# Patient Record
Sex: Male | Born: 1970 | Race: White | Hispanic: No | Marital: Married | Smoking: Never smoker
Health system: Southern US, Community
[De-identification: ages and names within clinical notes are randomized; demographics above are authoritative.]

## PROBLEM LIST (undated history)

## (undated) DIAGNOSIS — F32A Depression, unspecified: Secondary | ICD-10-CM

## (undated) DIAGNOSIS — M549 Dorsalgia, unspecified: Secondary | ICD-10-CM

## (undated) DIAGNOSIS — F4310 Post-traumatic stress disorder, unspecified: Secondary | ICD-10-CM

## (undated) DIAGNOSIS — F329 Major depressive disorder, single episode, unspecified: Secondary | ICD-10-CM

## (undated) DIAGNOSIS — G4730 Sleep apnea, unspecified: Secondary | ICD-10-CM

## (undated) DIAGNOSIS — K900 Celiac disease: Secondary | ICD-10-CM

## (undated) DIAGNOSIS — M25511 Pain in right shoulder: Secondary | ICD-10-CM

## (undated) DIAGNOSIS — M25569 Pain in unspecified knee: Secondary | ICD-10-CM

## (undated) DIAGNOSIS — S069X9A Unspecified intracranial injury with loss of consciousness of unspecified duration, initial encounter: Secondary | ICD-10-CM

## (undated) HISTORY — PX: CHOLECYSTECTOMY: SHX55

## (undated) HISTORY — PX: APPENDECTOMY: SHX54

---

## 2004-11-03 ENCOUNTER — Emergency Department: Payer: Self-pay | Admitting: Unknown Physician Specialty

## 2004-11-21 ENCOUNTER — Emergency Department: Payer: Self-pay | Admitting: Internal Medicine

## 2008-11-18 ENCOUNTER — Emergency Department: Payer: Self-pay | Admitting: Internal Medicine

## 2011-03-06 ENCOUNTER — Ambulatory Visit: Payer: Self-pay | Admitting: Gastroenterology

## 2012-02-14 ENCOUNTER — Ambulatory Visit: Payer: Self-pay | Admitting: Internal Medicine

## 2012-03-06 ENCOUNTER — Ambulatory Visit: Payer: Self-pay | Admitting: Internal Medicine

## 2012-08-05 ENCOUNTER — Emergency Department: Payer: Self-pay | Admitting: Emergency Medicine

## 2012-11-17 ENCOUNTER — Encounter (HOSPITAL_COMMUNITY): Payer: Self-pay | Admitting: Emergency Medicine

## 2012-11-17 ENCOUNTER — Emergency Department (HOSPITAL_COMMUNITY)
Admission: EM | Admit: 2012-11-17 | Discharge: 2012-11-17 | Disposition: A | Discharge status: Home / Self Care | Attending: Emergency Medicine | Admitting: Emergency Medicine

## 2012-11-17 ENCOUNTER — Emergency Department (HOSPITAL_COMMUNITY)

## 2012-11-17 DIAGNOSIS — Y9289 Other specified places as the place of occurrence of the external cause: Secondary | ICD-10-CM | POA: Insufficient documentation

## 2012-11-17 DIAGNOSIS — S43409A Unspecified sprain of unspecified shoulder joint, initial encounter: Secondary | ICD-10-CM

## 2012-11-17 DIAGNOSIS — X500XXA Overexertion from strenuous movement or load, initial encounter: Secondary | ICD-10-CM | POA: Insufficient documentation

## 2012-11-17 DIAGNOSIS — F3289 Other specified depressive episodes: Secondary | ICD-10-CM | POA: Insufficient documentation

## 2012-11-17 DIAGNOSIS — Z79899 Other long term (current) drug therapy: Secondary | ICD-10-CM | POA: Insufficient documentation

## 2012-11-17 DIAGNOSIS — Y9389 Activity, other specified: Secondary | ICD-10-CM | POA: Insufficient documentation

## 2012-11-17 DIAGNOSIS — Z8659 Personal history of other mental and behavioral disorders: Secondary | ICD-10-CM | POA: Insufficient documentation

## 2012-11-17 DIAGNOSIS — F329 Major depressive disorder, single episode, unspecified: Secondary | ICD-10-CM | POA: Insufficient documentation

## 2012-11-17 DIAGNOSIS — IMO0002 Reserved for concepts with insufficient information to code with codable children: Secondary | ICD-10-CM | POA: Insufficient documentation

## 2012-11-17 HISTORY — DX: Post-traumatic stress disorder, unspecified: F43.10

## 2012-11-17 HISTORY — DX: Depression, unspecified: F32.A

## 2012-11-17 HISTORY — DX: Major depressive disorder, single episode, unspecified: F32.9

## 2012-11-17 MED ORDER — IBUPROFEN 200 MG PO TABS
600.0000 mg | ORAL_TABLET | Freq: Once | ORAL | Status: AC
  Administered 2012-11-17: 600 mg via ORAL
  Filled 2012-11-17: qty 3

## 2012-11-17 MED ORDER — MELOXICAM 7.5 MG PO TABS: 15.0000 mg | ORAL_TABLET | Freq: Every day | ORAL | Status: DC

## 2012-11-17 MED ORDER — MELOXICAM 15 MG PO TABS: 15.0000 mg | ORAL_TABLET | Freq: Every day | ORAL | Status: DC

## 2012-11-17 NOTE — ED Provider Notes (Signed)
History     CSN: [id number]  Arrival date & time 11/17/12  1030   First MD Initiated Contact with Patient 11/17/12 1056      Chief Complaint  Patient presents with  . Shoulder Pain    (Consider location/radiation/quality/duration/timing/severity/associated sxs/prior treatment) HPI Comments: Patient with acute onset of right anterior shoulder pain after pulling on a heavy [place] door. He states that he felt a 'pop' in the shoulder. He can still move his arm but with significant tenderness. Patient states he has some tingling sensation on the ulnar aspect of his forearm and hand into his fourth and fifth digits. No treatments prior to arrival. No neck pain. No trouble breathing or shortness of breath. Onset acute. Course is constant. Nothing makes the pain worse. Nothing makes it better.  Patient is a 42 y.o. male presenting with shoulder pain. The history is provided by the patient.  Shoulder Pain Associated symptoms include arthralgias. Pertinent negatives include no joint swelling, neck pain, numbness or weakness.    Past Medical History  Diagnosis Date  . Depression   . PTSD (post-traumatic stress disorder)     Past Surgical History  Procedure Date  . Cholecystectomy   . Appendectomy     No family history on file.  History  Substance Use Topics  . Smoking status: Not on file  . Smokeless tobacco: Not on file  . Alcohol Use:       Review of Systems  Constitutional: Negative for activity change.  HENT: Negative for neck pain.   Musculoskeletal: Positive for arthralgias. Negative for back pain, joint swelling and gait problem.  Skin: Negative for wound.  Neurological: Negative for weakness and numbness.    Allergies  Review of patient's allergies indicates no known allergies.  Home Medications   Current Outpatient Rx  Name  Route  Sig  Dispense  Refill  . CLONAZEPAM 0.5 MG PO TABS   Oral   Take 0.5 mg by mouth 2 (two) times daily as needed. For anxiety           . MELOXICAM 15 MG PO TABS   Oral   Take 15 mg by mouth daily.         Marland Kitchen PRAZOSIN HCL 1 MG PO CAPS   Oral   Take 4 mg by mouth at bedtime.         . VENLAFAXINE HCL ER 150 MG PO CP24   Oral   Take 150 mg by mouth daily.           BP 122/90  Pulse 84  Temp 97.8 F (36.6 C) (Oral)  SpO2 97%  Physical Exam  Nursing note and vitals reviewed. Constitutional: He appears well-developed and well-nourished.  HENT:  Head: Normocephalic and atraumatic.  Eyes: Conjunctivae normal are normal.  Neck: Normal range of motion. Neck supple.  Cardiovascular: Normal pulses.   Musculoskeletal: He exhibits tenderness. He exhibits no edema.       Right shoulder: He exhibits tenderness and pain. He exhibits normal range of motion, no bony tenderness, no swelling, no deformity, no spasm, normal pulse and normal strength.       Left shoulder: Normal.       Cervical back: Normal. He exhibits normal range of motion, no tenderness and no bony tenderness.       Patient with anterior shoulder tenderness to palpation. Pain is worse with forced flexion of elbow and forced adduction of shoulder. Full range of motion without tenderness of elbow, wrist, and  hand.  Neurological: He is alert. A sensory deficit is present. He exhibits normal muscle tone.       Patient with tingling sensation in ulnar aspect of hand and forearm.  Skin: Skin is warm and dry.  Psychiatric: He has a normal mood and affect.    ED Course  Procedures (including critical care time)  Labs Reviewed - No data to display Dg Shoulder Right  11/17/2012  *RADIOLOGY REPORT*  Clinical Data: Audible pop involving the right shoulder earlier today.  Decreased range of motion and pain.  RIGHT SHOULDER - 2+ VIEW  Comparison: None.  Findings: No evidence of acute fracture or glenohumeral dislocation.  Acromioclavicular joint intact without significant degenerative changes.  Well-preserved bone mineral density.  Bone island in the humeral  head.  No significant intrinsic osseous abnormality.  Note made of thoracic scoliosis convex right.  IMPRESSION: No acute or significant abnormality.   Original Report Authenticated By: Hulan Saas, M.D.      1. Shoulder sprain     11:01 AM Patient seen and examined. X-ray ordered. Medications ordered.   Vital signs reviewed and are as follows: Filed Vitals:   11/17/12 1043  BP: 122/90  Pulse: 84  Temp: 97.8 F (36.6 C)   11:53 AM x-ray results reviewed. Sling by orthopedic technician. Patient informed of results. D/w Dr. Ignacia Palma prior to discharge. Will refill prescription for meloxicam which patient usually uses as a when necessary medication for headaches. Counseled on RICE protocol.  Urged to followup with orthopedic physician if not improved in 5-7 days. Patient verbalizes understanding and agrees with the plan.  MDM  Patient with shoulder injury, negative x-rays. Conservative management indicated. Strength is intact. Patient does have some paresthesias in his arm. No evidence of dislocation. Orthopedic followup indicated if not improved with conservative management.       Renne Crigler, [place] 11/17/12 1155

## 2012-11-17 NOTE — ED Notes (Signed)
Pt was opening a door at work and he head a pop in shoulder and has pain; is able to move and rotate currently. Tender at joint and hurts with raising it.

## 2012-11-17 NOTE — Progress Notes (Signed)
Orthopedic Tech Progress Note Patient Details:  Harold [name] 04/19/1971 [id number] Arm sling applied to patient's Right UE and fitted for comfort. Application tolerated well.  Ortho Devices Type of Ortho Device: Arm sling Ortho Device/Splint Location: Right Ortho Device/Splint Interventions: Application   Asia R Thompson 11/17/2012, 11:39 AM

## 2012-11-17 NOTE — ED Provider Notes (Signed)
Medical screening examination/treatment/procedure(s) were performed by non-physician practitioner and as supervising physician I was immediately available for consultation/collaboration.   Carleene Cooper III, MD 11/17/12 2138

## 2013-03-29 ENCOUNTER — Emergency Department: Payer: Self-pay | Admitting: Emergency Medicine

## 2013-05-10 ENCOUNTER — Encounter (HOSPITAL_COMMUNITY): Payer: Self-pay | Admitting: Emergency Medicine

## 2013-05-10 ENCOUNTER — Emergency Department (HOSPITAL_COMMUNITY)
Admission: EM | Admit: 2013-05-10 | Discharge: 2013-05-10 | Disposition: A | Discharge status: Home / Self Care | Attending: Emergency Medicine | Admitting: Emergency Medicine

## 2013-05-10 DIAGNOSIS — R209 Unspecified disturbances of skin sensation: Secondary | ICD-10-CM | POA: Insufficient documentation

## 2013-05-10 DIAGNOSIS — F4310 Post-traumatic stress disorder, unspecified: Secondary | ICD-10-CM | POA: Insufficient documentation

## 2013-05-10 DIAGNOSIS — F3289 Other specified depressive episodes: Secondary | ICD-10-CM | POA: Insufficient documentation

## 2013-05-10 DIAGNOSIS — F329 Major depressive disorder, single episode, unspecified: Secondary | ICD-10-CM | POA: Insufficient documentation

## 2013-05-10 DIAGNOSIS — Z79899 Other long term (current) drug therapy: Secondary | ICD-10-CM | POA: Insufficient documentation

## 2013-05-10 NOTE — ED Notes (Signed)
Pt. Claimed of feeling weak but denied of any thoughts of hurting himself  Or others at this time.

## 2013-05-10 NOTE — ED Notes (Signed)
Family at bedside. 

## 2013-05-10 NOTE — ED Notes (Signed)
Pt. Ambulated to the bathroom without difficulty.

## 2013-05-10 NOTE — ED Notes (Signed)
WUJ:[id number]<JY> Expected date:05/09/13<BR> Expected time:11:50 PM<BR> Means of arrival:Ambulance<BR> Comments:<BR> Medication reaction

## 2013-05-10 NOTE — ED Notes (Signed)
MD at pt.'s room, pt.'s wife requested to be transferred to [facility] as soon as possible.

## 2013-05-10 NOTE — ED Provider Notes (Signed)
History    CSN: [id number] Arrival date & time 05/10/13  0021  First MD Initiated Contact with Patient 05/10/13 0044     Chief Complaint  Patient presents with  . Weakness  . Suicidal    HPI The patient is followed closely by the [facility] and has a history of depression and posttraumatic stress disorder.  He has overdosed before in the past.  Several days ago he was started on Remeron and tizanidine by his psychiatrist.  He states since starting these medications he's felt more weak than normal and has abnormal sensation in his face.  The patient's had vague suicidal thoughts this week but he has no suicidal thoughts at this time.  He is not feels there is homicidal or suicidal.  The patient's wife spoke with the [facility] who recommended that he come to the emergency department for evaluation.  No hallucinations.  He denies overdose.  He states been taking his medications as prescribed.  He was on his way to work this evening as he works third shift when he felt weak and called his wife who recommended that he come to the emergency department   Past Medical History  Diagnosis Date  . Depression   . PTSD (post-traumatic stress disorder)    Past Surgical History  Procedure Laterality Date  . Cholecystectomy    . Appendectomy     No family history on file. History  Substance Use Topics  . Smoking status: Never Smoker   . Smokeless tobacco: Not on file  . Alcohol Use: Not on file     Comment: occasional    Review of Systems  All other systems reviewed and are negative.    Allergies  Review of patient's allergies indicates no known allergies.  Home Medications   Current Outpatient Rx  Name  Route  Sig  Dispense  Refill  . clonazePAM (KLONOPIN) 0.5 MG tablet   Oral   Take 0.5 mg by mouth 2 (two) times daily as needed. For anxiety         . meloxicam (MOBIC) 15 MG tablet   Oral   Take 1 tablet (15 mg total) by mouth daily.   10 tablet   0   . prazosin  (MINIPRESS) 1 MG capsule   Oral   Take 4 mg by mouth at bedtime.         Marland Kitchen venlafaxine XR (EFFEXOR-XR) 150 MG 24 hr capsule   Oral   Take 150 mg by mouth daily.          BP 122/77  Pulse 81  Temp(Src) 97.4 F (36.3 C) (Oral)  Resp 15  SpO2 98% Physical Exam  Nursing note and vitals reviewed. Constitutional: He is oriented to person, place, and time. He appears well-developed and well-nourished.  HENT:  Head: Normocephalic.  Eyes: EOM are normal.  Neck: Normal range of motion.  Pulmonary/Chest: Effort normal.  Abdominal: He exhibits no distension.  Musculoskeletal: Normal range of motion.  Neurological: He is alert and oriented to person, place, and time.  Psychiatric: He has a normal mood and affect. His behavior is normal. Judgment and thought content normal. His speech is not rapid and/or pressured and not slurred. He is not agitated and not aggressive. Cognition and memory are normal. He expresses no homicidal and no suicidal ideation.    ED Course  Procedures (including critical care time) Labs Reviewed - No data to display No results found.  1. PTSD (post-traumatic stress disorder)  MDM  I recommended that the patient stop his Remeron and tizanidine.  I've asked that he call his psychiatrist for followup on Monday.  I do not believe the patient to be a threat to himself or to others.  The wife states she has HTN taking the patient to the [facility] this evening.  I stated that I do not believe this is necessary as I believe he is stable to followup with his psychiatrist on Monday.  I do not believe the patient needs acute psychiatric hospitalization.  I do not believe the patient is a threat to himself or others at this time.  I think he'll feel much better after stopping his medications and following up with a psychiatrist.  Lyanne Co, MD 05/10/13 [phone]

## 2013-05-10 NOTE — ED Notes (Signed)
Patient denies pain and is resting comfortably.  

## 2013-05-10 NOTE — ED Notes (Signed)
Pt. 's wife thought that pt. Overdosed his new prescriptions, Tizanidine and Remeron last night but pt. Denies this. Pt. Is alert and oriented x4, claimed that he just started feeling weak when he started taking those medications for the past 2 days now. Pt. Has ptsd and claimed of "still having thoughts of hurting self or others but not at this time."

## 2013-05-10 NOTE — ED Notes (Signed)
Pt taking many meds for ptsd. Started Remeron 15 mg at bedtime and tizanidine 4 mg. Pt woke up and doesn't feel right

## 2013-07-04 ENCOUNTER — Encounter (HOSPITAL_COMMUNITY): Payer: Self-pay | Admitting: *Deleted

## 2013-07-04 ENCOUNTER — Inpatient Hospital Stay (HOSPITAL_COMMUNITY)
Admission: AD | Admit: 2013-07-04 | Discharge: 2013-07-09 | DRG: 897 | Disposition: A | Discharge status: Home / Self Care | Attending: Psychiatry | Admitting: Psychiatry

## 2013-07-04 DIAGNOSIS — F332 Major depressive disorder, recurrent severe without psychotic features: Secondary | ICD-10-CM | POA: Diagnosis present

## 2013-07-04 DIAGNOSIS — M25511 Pain in right shoulder: Secondary | ICD-10-CM

## 2013-07-04 DIAGNOSIS — K900 Celiac disease: Secondary | ICD-10-CM

## 2013-07-04 DIAGNOSIS — M25569 Pain in unspecified knee: Secondary | ICD-10-CM

## 2013-07-04 DIAGNOSIS — F10939 Alcohol use, unspecified with withdrawal, unspecified: Principal | ICD-10-CM | POA: Diagnosis present

## 2013-07-04 DIAGNOSIS — S069XAA Unspecified intracranial injury with loss of consciousness status unknown, initial encounter: Secondary | ICD-10-CM

## 2013-07-04 DIAGNOSIS — S069X9A Unspecified intracranial injury with loss of consciousness of unspecified duration, initial encounter: Secondary | ICD-10-CM

## 2013-07-04 DIAGNOSIS — F4310 Post-traumatic stress disorder, unspecified: Secondary | ICD-10-CM | POA: Diagnosis present

## 2013-07-04 DIAGNOSIS — F1320 Sedative, hypnotic or anxiolytic dependence, uncomplicated: Secondary | ICD-10-CM | POA: Diagnosis present

## 2013-07-04 DIAGNOSIS — M549 Dorsalgia, unspecified: Secondary | ICD-10-CM

## 2013-07-04 DIAGNOSIS — G4730 Sleep apnea, unspecified: Secondary | ICD-10-CM

## 2013-07-04 DIAGNOSIS — F1010 Alcohol abuse, uncomplicated: Secondary | ICD-10-CM | POA: Diagnosis present

## 2013-07-04 DIAGNOSIS — Z79899 Other long term (current) drug therapy: Secondary | ICD-10-CM

## 2013-07-04 DIAGNOSIS — F10239 Alcohol dependence with withdrawal, unspecified: Principal | ICD-10-CM | POA: Diagnosis present

## 2013-07-04 DIAGNOSIS — R45851 Suicidal ideations: Secondary | ICD-10-CM

## 2013-07-04 HISTORY — DX: Unspecified intracranial injury with loss of consciousness status unknown, initial encounter: S06.9XAA

## 2013-07-04 HISTORY — DX: Sleep apnea, unspecified: G47.30

## 2013-07-04 HISTORY — DX: Pain in unspecified knee: M25.569

## 2013-07-04 HISTORY — DX: Dorsalgia, unspecified: M54.9

## 2013-07-04 HISTORY — DX: Unspecified intracranial injury with loss of consciousness of unspecified duration, initial encounter: S06.9X9A

## 2013-07-04 HISTORY — DX: Pain in right shoulder: M25.511

## 2013-07-04 HISTORY — DX: Celiac disease: K90.0

## 2013-07-04 MED ORDER — CHLORDIAZEPOXIDE HCL 25 MG PO CAPS
25.0000 mg | ORAL_CAPSULE | Freq: Three times a day (TID) | ORAL | Status: AC
  Administered 2013-07-06 – 2013-07-07 (×3): 25 mg via ORAL
  Filled 2013-07-04 (×3): qty 1

## 2013-07-04 MED ORDER — CHLORDIAZEPOXIDE HCL 25 MG PO CAPS
25.0000 mg | ORAL_CAPSULE | Freq: Four times a day (QID) | ORAL | Status: AC
  Administered 2013-07-05 – 2013-07-06 (×6): 25 mg via ORAL
  Filled 2013-07-04 (×6): qty 1

## 2013-07-04 MED ORDER — CHLORDIAZEPOXIDE HCL 25 MG PO CAPS: 25.0000 mg | ORAL_CAPSULE | Freq: Once | ORAL | Status: DC

## 2013-07-04 MED ORDER — CHLORDIAZEPOXIDE HCL 25 MG PO CAPS
25.0000 mg | ORAL_CAPSULE | Freq: Four times a day (QID) | ORAL | Status: AC | PRN
  Administered 2013-07-06: 25 mg via ORAL
  Filled 2013-07-04: qty 1

## 2013-07-04 MED ORDER — HYDROXYZINE HCL 25 MG PO TABS
25.0000 mg | ORAL_TABLET | Freq: Four times a day (QID) | ORAL | Status: AC | PRN
  Administered 2013-07-05 – 2013-07-06 (×3): 25 mg via ORAL

## 2013-07-04 MED ORDER — CHLORDIAZEPOXIDE HCL 25 MG PO CAPS
25.0000 mg | ORAL_CAPSULE | Freq: Every day | ORAL | Status: AC
  Administered 2013-07-09: 25 mg via ORAL

## 2013-07-04 MED ORDER — MIRTAZAPINE 15 MG PO TABS
15.0000 mg | ORAL_TABLET | Freq: Every day | ORAL | Status: DC
  Administered 2013-07-05: 15 mg via ORAL
  Filled 2013-07-04 (×4): qty 1

## 2013-07-04 MED ORDER — MAGNESIUM HYDROXIDE 400 MG/5ML PO SUSP: 30.0000 mL | Freq: Every day | ORAL | Status: DC | PRN

## 2013-07-04 MED ORDER — ALUM & MAG HYDROXIDE-SIMETH 200-200-20 MG/5ML PO SUSP: 30.0000 mL | ORAL | Status: DC | PRN

## 2013-07-04 MED ORDER — LOPERAMIDE HCL 2 MG PO CAPS: 2.0000 mg | ORAL_CAPSULE | ORAL | Status: AC | PRN

## 2013-07-04 MED ORDER — VENLAFAXINE HCL ER 150 MG PO CP24
300.0000 mg | ORAL_CAPSULE | Freq: Every day | ORAL | Status: DC
  Filled 2013-07-04: qty 2

## 2013-07-04 MED ORDER — TRAZODONE HCL 50 MG PO TABS
50.0000 mg | ORAL_TABLET | Freq: Every evening | ORAL | Status: DC | PRN
  Administered 2013-07-05: 50 mg via ORAL
  Filled 2013-07-04: qty 1

## 2013-07-04 MED ORDER — CHLORDIAZEPOXIDE HCL 25 MG PO CAPS
25.0000 mg | ORAL_CAPSULE | ORAL | Status: AC
  Administered 2013-07-07 – 2013-07-08 (×2): 25 mg via ORAL
  Filled 2013-07-04 (×2): qty 1

## 2013-07-04 MED ORDER — TIZANIDINE HCL 4 MG PO TABS
4.0000 mg | ORAL_TABLET | Freq: Every day | ORAL | Status: DC
  Administered 2013-07-05 – 2013-07-08 (×4): 4 mg via ORAL
  Filled 2013-07-04 (×7): qty 1

## 2013-07-04 MED ORDER — CYCLOBENZAPRINE HCL 10 MG PO TABS
10.0000 mg | ORAL_TABLET | Freq: Every day | ORAL | Status: DC | PRN
  Administered 2013-07-05 – 2013-07-09 (×3): 10 mg via ORAL
  Filled 2013-07-04 (×3): qty 1

## 2013-07-04 MED ORDER — ADULT MULTIVITAMIN W/MINERALS CH
1.0000 | ORAL_TABLET | Freq: Every day | ORAL | Status: DC
  Administered 2013-07-05 – 2013-07-09 (×5): 1 via ORAL
  Filled 2013-07-04 (×7): qty 1

## 2013-07-04 MED ORDER — NAPROXEN 500 MG PO TABS
500.0000 mg | ORAL_TABLET | Freq: Every day | ORAL | Status: DC
  Administered 2013-07-05 – 2013-07-09 (×5): 500 mg via ORAL
  Filled 2013-07-04 (×7): qty 1

## 2013-07-04 MED ORDER — TRAMADOL HCL 50 MG PO TABS
50.0000 mg | ORAL_TABLET | Freq: Two times a day (BID) | ORAL | Status: DC | PRN
  Administered 2013-07-05 – 2013-07-07 (×4): 50 mg via ORAL
  Filled 2013-07-04 (×5): qty 1

## 2013-07-04 MED ORDER — ONDANSETRON 4 MG PO TBDP: 4.0000 mg | ORAL_TABLET | Freq: Four times a day (QID) | ORAL | Status: AC | PRN

## 2013-07-04 MED ORDER — ACETAMINOPHEN 325 MG PO TABS: 650.0000 mg | ORAL_TABLET | Freq: Four times a day (QID) | ORAL | Status: DC | PRN
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
## 2013-07-04 NOTE — Progress Notes (Signed)
Adult Psychoeducational Group Note  Date:  07/04/2013 Time:  10:08 PM  Group Topic/Focus: AA Support Group  Participation Level:  Active  Harold [name] [name] 07/04/2013, 10:08 PM 

## 2013-07-05 ENCOUNTER — Encounter (HOSPITAL_COMMUNITY): Payer: Self-pay | Admitting: Psychiatry

## 2013-07-05 DIAGNOSIS — F1010 Alcohol abuse, uncomplicated: Secondary | ICD-10-CM | POA: Diagnosis present

## 2013-07-05 DIAGNOSIS — F39 Unspecified mood [affective] disorder: Secondary | ICD-10-CM

## 2013-07-05 LAB — RAPID URINE DRUG SCREEN, HOSP PERFORMED
Amphetamines: NOT DETECTED
Barbiturates: NOT DETECTED
Benzodiazepines: NOT DETECTED
Cocaine: NOT DETECTED
Tetrahydrocannabinol: NOT DETECTED

## 2013-07-05 LAB — URINALYSIS, ROUTINE W REFLEX MICROSCOPIC
Bilirubin Urine: NEGATIVE
Glucose, UA: NEGATIVE mg/dL
Hgb urine dipstick: NEGATIVE
Ketones, ur: NEGATIVE mg/dL
Specific Gravity, Urine: 1.02 (ref 1.005–1.030)
pH: 5.5 (ref 5.0–8.0)

## 2013-07-05 MED ORDER — VENLAFAXINE HCL ER 75 MG PO CP24
225.0000 mg | ORAL_CAPSULE | Freq: Every day | ORAL | Status: DC
  Administered 2013-07-05 – 2013-07-08 (×4): 225 mg via ORAL
  Filled 2013-07-05 (×6): qty 3

## 2013-07-05 MED ORDER — CARBAMAZEPINE ER 200 MG PO TB12
200.0000 mg | ORAL_TABLET | Freq: Two times a day (BID) | ORAL | Status: DC
  Administered 2013-07-05 – 2013-07-09 (×9): 200 mg via ORAL
  Filled 2013-07-05 (×13): qty 1
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
## 2013-07-05 NOTE — Progress Notes (Signed)
D.  Pt pleasant on approach, positive for evening AA group.  Interacting appropriately within milieu.  Denies SI/HI/hallucinations at this time.  States not sleeping all that well, but hopeful tonight with ordered sleep medications.  A.  Support and encouragement offered  R.  Pt remains safe on unit, will continue to monitor.

## 2013-07-05 NOTE — Progress Notes (Signed)
Adult Psychoeducational Group Note  Date:  07/05/2013 Time:  0900  Group Topic/Focus:  Self Inventory  Participation Level:  Minimal  Participation Quality:  Appropriate  Affect:  Lethargic  Cognitive:  Appropriate  Insight: Lacking  Engagement in Group:  Limited  Modes of Intervention:  Discussion  Additional Comments:  Pt did not speak during group, but was also removed by MD.  Barbette Merino, Delmi Fulfer Shari Prows 07/05/2013, 10:51 AM

## 2013-07-05 NOTE — Progress Notes (Signed)
Patient did attend the evening speaker AA meeting.  
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
## 2013-07-05 NOTE — Progress Notes (Signed)
Patient ID: Harold [name], male   DOB: 05-Apr-1971, 42 y.o.   MRN: [id number] Attempt made to meet with patient and complete PSA at 3:40 PM today.  Patient engaged in activity with MHT group at the time. Carney Bern, LCSW
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
## 2013-07-05 NOTE — [facility] Group Notes (Signed)
[facility] Group Notes:  (Nursing/MHT/Case Management/Adjunct)  Date:  07/05/2013  Time:  2:43 PM  Type of Therapy:  Nurse Education  Participation Level:  Minimal  Participation Quality:  Appropriate  Affect:  Appropriate  Cognitive:  Appropriate  Insight:  Improving  Engagement in Group:  Lacking  Modes of Intervention:  Problem-solving  Summary of Progress/Problems:  Cresenciano Lick 07/05/2013, 2:43 PM

## 2013-07-05 NOTE — Progress Notes (Signed)
D   Pt is irritable and somewhat anxious   He is visible on the milue but has limited interaction with others  He has been attending group but has limited participation   He contracts for safety and reports fleeting /passive suicidal ideation   He is depressed and somewhat reserved A   Verbal support given   Medications administered and effectiveness monitored   Q 15 min checks R   Pt safe at present

## 2013-07-06 LAB — TSH: TSH: 1.695 u[IU]/mL (ref 0.350–4.500)

## 2013-07-06 LAB — HEMOGLOBIN A1C: Mean Plasma Glucose: 111 mg/dL (ref <117)

## 2013-07-06 LAB — LIPID PANEL
Cholesterol: 216 mg/dL — ABNORMAL HIGH (ref 0–200)
HDL: 45 mg/dL (ref >39)
Total CHOL/HDL Ratio: 4.8 RATIO
Triglycerides: 118 mg/dL (ref <150)

## 2013-07-06 LAB — COMPREHENSIVE METABOLIC PANEL
AST: 21 U/L (ref 0–37)
Albumin: 4 g/dL (ref 3.5–5.2)
Chloride: 103 mEq/L (ref 96–112)
Creatinine, Ser: 0.93 mg/dL (ref 0.50–1.35)
Total Bilirubin: 0.3 mg/dL (ref 0.3–1.2)
Total Protein: 7 g/dL (ref 6.0–8.3)

## 2013-07-06 LAB — CBC
MCHC: 33.6 g/dL (ref 30.0–36.0)
MCV: 86.9 fL (ref 78.0–100.0)
Platelets: 153 10*3/uL (ref 150–400)
RDW: 12.6 % (ref 11.5–15.5)
WBC: 6.1 10*3/uL (ref 4.0–10.5)

## 2013-07-06 MED ORDER — MIRTAZAPINE 30 MG PO TABS
30.0000 mg | ORAL_TABLET | Freq: Every day | ORAL | Status: DC
  Administered 2013-07-06 – 2013-07-08 (×3): 30 mg via ORAL
  Filled 2013-07-06 (×7): qty 1

## 2013-07-06 MED ORDER — TRAZODONE HCL 100 MG PO TABS
100.0000 mg | ORAL_TABLET | Freq: Every evening | ORAL | Status: DC | PRN
  Administered 2013-07-06 (×2): 100 mg via ORAL

## 2013-07-06 NOTE — [facility] Group Notes (Signed)
[facility] Group Notes:  (Clinical Social Work)  07/06/2013  10:00-11:00AM  Summary of Progress/Problems:   The main focus of today's process group was to   identify the patient's current support system and decide on other supports that can be put in place.  The picture on workbook was used to discuss why additional supports are needed, and a hand-out was distributed with four definitions/levels of support, then used to talk about how patients have given and received all different kinds of support.  An emphasis was placed on using counselor, doctor, therapy groups, 12-step groups, and problem-specific support groups to expand supports.  The patient identified one additional support as being reaching out to friends.  He stated that his only support now is [facility] where he does have a therapist.  He has been court-ordered to stay away from his family for 30 days so states he cannot talk on the phone or get any support there for at least the next month.  Type of Therapy:  Process Group with Motivational Interviewing  Participation Level:  Active  Participation Quality:  Attentive, Sharing and Supportive  Affect:  Blunted  Cognitive:  Oriented  Insight:  Engaged  Engagement in Therapy:  Engaged  Modes of Intervention:   Education, Support and Processing, Activity  [employer], LCSW 07/06/2013, 12:44 PM

## 2013-07-06 NOTE — Progress Notes (Signed)
Psychoeducational Group Note  Date:  07/06/2013 Time:  0945 am  Group Topic/Focus:  Making Healthy Choices:   The focus of this group is to help patients identify negative/unhealthy choices they were using prior to admission and identify positive/healthier coping strategies to replace them upon discharge.  Participation Level:  Active  Participation Quality:  Appropriate  Affect:  Appropriate  Cognitive:  Alert and Appropriate  Insight:  Engaged  Engagement in Group:  Developing/Improving  Additional Comments:    Andrena Mews 07/06/2013, 10:29 AM

## 2013-07-06 NOTE — Progress Notes (Signed)
D.  Pt pleasant on approach, denies complaints other than chronic back pain.  Pt pleased that he has his knee brace now, states less knee pain.  Denies SI/HI/hallucinations at this time.  Positive for evening AA group.  Interacting appropriately with peers on unit.  Wife called, states that she will bring his C-Pap machine up here tomorrow as well as Pt's healthcare POA.  Wife pleased that Pt is going to group.  Wife hopeful to work out other arrangements rather than the 30 day restraining order that CPS has been insistent that she must have.  Pt's wife states that there are 7 children in the home and that Pt is a good father.  She states that his main problem is that he has PTSD from his years of service in the [employer].  A.  Support and encouragement offered.  Medication given as ordered for stated back pain  R. Pt remains safe on unit, will continue to monitor.

## 2013-07-06 NOTE — Progress Notes (Signed)
Adult Psychoeducational Group Note  Date:  07/06/2013 Time:  0900  Group Topic/Focus:  Self Inventory  Participation Level:  Active  Participation Quality:  Appropriate and Attentive  Affect:  Appropriate, Depressed and Irritable  Cognitive:  Alert and Appropriate  Insight: Good  Engagement in Group:  Engaged  Modes of Intervention:  Discussion  Additional Comments:  Pt is open and attentive during group.  Baili Stang Shari Prows 07/06/2013, 10:19 AM

## 2013-07-06 NOTE — Progress Notes (Signed)
D   Pt is irritable and somewhat anxious   He is visible on the milue but has limited interaction with others  He has been attending group but has limited participation   He contracts for safety and reports fleeting /passive suicidal ideation   He is depressed and somewhat reserved   Pt attended group and participated  He reports feeling some better and he is pleasant and cooperative  He received his knee brace after lunch A   Verbal support given   Medications administered and effectiveness monitored   Q 15 min checks R   Pt safe at present

## 2013-07-06 NOTE — Progress Notes (Signed)
[facility] MD Progress Note  07/06/2013 3:24 PM Harold [name]  MRN:  [id number] Subjective:  Continues to have a hard time. He is not able to return to the home for at least 30 days. His son is upset about what happened. He states that it has been very difficult to deal with him. States that his wife lets him handle the discipline not considering that he has the PTSD, and that he has issues with temper, anger. He is still endorsing suicidal ruminations. States that he knows he needs the help. He is willing to continue to take the medications and give them a good try. He also knows he needs further help once he is D/C from here Diagnosis:   DSM5: Schizophrenia Disorders:   Obsessive-Compulsive Disorders:   Trauma-Stressor Disorders:  Posttraumatic Stress Disorder (309.81) Substance/Addictive Disorders:  Alcohol Related Disorder - Moderate (303.90) Depressive Disorders:    Axis I: Mood Disorder NOS  ADL's:  Intact  Sleep: Fair  Appetite:  Fair  Suicidal Ideation:  Plan:  denies Intent:  denies Means:  denies Homicidal Ideation:  Plan:  denies Intent:  denies Means:  denies AEB (as evidenced by):  Psychiatric Specialty Exam: Review of Systems  Constitutional: Negative.   HENT: Negative.   Eyes: Negative.   Respiratory: Negative.   Cardiovascular: Negative.   Genitourinary: Negative.   Musculoskeletal: Positive for joint pain.  Skin: Negative.   Neurological: Negative.   Endo/Heme/Allergies: Negative.   Psychiatric/Behavioral: Positive for depression, suicidal ideas and substance abuse. The patient is nervous/anxious and has insomnia.     Blood pressure 128/88, pulse 69, temperature 97.7 F (36.5 C), temperature source Oral, resp. rate 16, height 6\' 5"  (1.956 m), weight 118.842 kg (262 lb), SpO2 99.00%.Body mass index is 31.06 kg/(m^2).  General Appearance: Fairly Groomed and limping due to knee injury  Eye Contact::  Fair  Speech:  Clear and Coherent and not spontaneous  Volume:   Decreased  Mood:  Anxious, Depressed, Irritable and worried  Affect:  Restricted  Thought Process:  Coherent and Goal Directed  Orientation:  Full (Time, Place, and Person)  Thought Content:  worries, concerns  Suicidal Thoughts:  Yes.  without intent/plan  Homicidal Thoughts:  No  Memory:  Immediate;   Fair  Judgement:  Fair  Insight:  Present  Psychomotor Activity:  Restlessness  Concentration:  Fair  Recall:  Fair  Akathisia:  No  Handed:  Right  AIMS (if indicated):     Assets:  Desire for Improvement  Sleep:  Number of Hours: 6.25   Current Medications: Current Facility-Administered Medications  Medication Dose Route Frequency Provider Last Rate Last Dose  . acetaminophen (TYLENOL) tablet 650 mg  650 mg Oral Q6H PRN Court Joy, PA-C      . alum & mag hydroxide-simeth (MAALOX/MYLANTA) 200-200-20 MG/5ML suspension 30 mL  30 mL Oral Q4H PRN Court Joy, PA-C      . carbamazepine (TEGRETOL XR) 12 hr tablet 200 mg  200 mg Oral BID Rachael Fee, MD   200 mg at 07/06/13 0802  . chlordiazePOXIDE (LIBRIUM) capsule 25 mg  25 mg Oral Q6H PRN Court Joy, PA-C      . chlordiazePOXIDE (LIBRIUM) capsule 25 mg  25 mg Oral Once Court Joy, PA-C      . chlordiazePOXIDE (LIBRIUM) capsule 25 mg  25 mg Oral TID Court Joy, PA-C       Followed by  . [START ON 07/07/2013] chlordiazePOXIDE (LIBRIUM) capsule 25 mg  25 mg Oral BH-qamhs Court Joy, PA-C       Followed by  . [START ON 07/09/2013] chlordiazePOXIDE (LIBRIUM) capsule 25 mg  25 mg Oral Daily Court Joy, PA-C      . cyclobenzaprine (FLEXERIL) tablet 10 mg  10 mg Oral Daily PRN Court Joy, PA-C   10 mg at 07/05/13 1206  . hydrOXYzine (ATARAX/VISTARIL) tablet 25 mg  25 mg Oral Q6H PRN Court Joy, PA-C   25 mg at 07/06/13 1132  . loperamide (IMODIUM) capsule 2-4 mg  2-4 mg Oral PRN Court Joy, PA-C      . magnesium hydroxide (MILK OF MAGNESIA) suspension 30 mL  30 mL Oral Daily PRN Court Joy, PA-C      . mirtazapine (REMERON) tablet 30 mg  30 mg Oral QHS Rachael Fee, MD      . multivitamin with minerals tablet 1 tablet  1 tablet Oral Daily Court Joy, PA-C   1 tablet at 07/06/13 1610  . naproxen (NAPROSYN) tablet 500 mg  500 mg Oral Daily Court Joy, PA-C   500 mg at 07/06/13 0802  . ondansetron (ZOFRAN-ODT) disintegrating tablet 4 mg  4 mg Oral Q6H PRN Court Joy, PA-C      . tiZANidine (ZANAFLEX) tablet 4 mg  4 mg Oral QHS Court Joy, PA-C   4 mg at 07/05/13 2124  . traMADol (ULTRAM) tablet 50 mg  50 mg Oral BID PRN Court Joy, PA-C   50 mg at 07/06/13 1132  . traZODone (DESYREL) tablet 100 mg  100 mg Oral QHS PRN,MR X 1 Rachael Fee, MD      . venlafaxine XR (EFFEXOR-XR) 24 hr capsule 225 mg  225 mg Oral QHS Rachael Fee, MD   225 mg at 07/05/13 2124    Lab Results:  Results for orders placed during the hospital encounter of 07/04/13 (from the past 48 hour(s))  URINALYSIS, ROUTINE W REFLEX MICROSCOPIC     Status: Abnormal   Collection Time    07/05/13  6:11 AM      Result Value Range   Color, Urine YELLOW  YELLOW   APPearance CLOUDY (*) CLEAR   Specific Gravity, Urine 1.020  1.005 - 1.030   pH 5.5  5.0 - 8.0   Glucose, UA NEGATIVE  NEGATIVE mg/dL   Hgb urine dipstick NEGATIVE  NEGATIVE   Bilirubin Urine NEGATIVE  NEGATIVE   Ketones, ur NEGATIVE  NEGATIVE mg/dL   Protein, ur NEGATIVE  NEGATIVE mg/dL   Urobilinogen, UA 0.2  0.0 - 1.0 mg/dL   Nitrite NEGATIVE  NEGATIVE   Leukocytes, UA NEGATIVE  NEGATIVE   Comment: MICROSCOPIC NOT DONE ON URINES WITH NEGATIVE PROTEIN, BLOOD, LEUKOCYTES, NITRITE, OR GLUCOSE <1000 mg/dL.     Performed at [facility]  URINE RAPID DRUG SCREEN (HOSP PERFORMED)     Status: None   Collection Time    07/05/13  6:11 AM      Result Value Range   Opiates NONE DETECTED  NONE DETECTED   Cocaine NONE DETECTED  NONE DETECTED   Benzodiazepines NONE DETECTED  NONE DETECTED   Amphetamines NONE  DETECTED  NONE DETECTED   Tetrahydrocannabinol NONE DETECTED  NONE DETECTED   Barbiturates NONE DETECTED  NONE DETECTED   Comment:            DRUG SCREEN FOR MEDICAL PURPOSES     ONLY.  IF CONFIRMATION IS NEEDED  FOR ANY PURPOSE, NOTIFY LAB     WITHIN 5 DAYS.                LOWEST DETECTABLE LIMITS     FOR URINE DRUG SCREEN     Drug Class       Cutoff (ng/mL)     Amphetamine      1000     Barbiturate      200     Benzodiazepine   200     Tricyclics       300     Opiates          300     Cocaine          300     THC              50     Performed at [facility]  CBC     Status: None   Collection Time    07/06/13  6:42 AM      Result Value Range   WBC 6.1  4.0 - 10.5 K/uL   RBC 5.20  4.22 - 5.81 MIL/uL   Hemoglobin 15.2  13.0 - 17.0 g/dL   HCT 16.1  09.6 - 04.5 %   MCV 86.9  78.0 - 100.0 fL   MCH 29.2  26.0 - 34.0 pg   MCHC 33.6  30.0 - 36.0 g/dL   RDW 40.9  81.1 - 91.4 %   Platelets 153  150 - 400 K/uL   Comment: Performed at [facility]  COMPREHENSIVE METABOLIC PANEL     Status: Abnormal   Collection Time    07/06/13  6:42 AM      Result Value Range   Sodium 139  135 - 145 mEq/L   Potassium 4.2  3.5 - 5.1 mEq/L   Chloride 103  96 - 112 mEq/L   CO2 30  19 - 32 mEq/L   Glucose, Bld 117 (*) 70 - 99 mg/dL   BUN 11  6 - 23 mg/dL   Creatinine, Ser 7.82  0.50 - 1.35 mg/dL   Calcium 9.4  8.4 - 95.6 mg/dL   Total Protein 7.0  6.0 - 8.3 g/dL   Albumin 4.0  3.5 - 5.2 g/dL   AST 21  0 - 37 U/L   ALT 21  0 - 53 U/L   Alkaline Phosphatase 69  39 - 117 U/L   Total Bilirubin 0.3  0.3 - 1.2 mg/dL   GFR calc non Af Amer >90  >90 mL/min   GFR calc Af Amer >90  >90 mL/min   Comment: (NOTE)     The eGFR has been calculated using the CKD EPI equation.     This calculation has not been validated in all clinical situations.     eGFR's persistently <90 mL/min signify possible Chronic Kidney     Disease.     Performed at [facility]  LIPID PANEL     Status: Abnormal   Collection Time    07/06/13  6:42 AM      Result Value Range   Cholesterol 216 (*) 0 - 200 mg/dL   Triglycerides 213  <086 mg/dL   HDL 45  >57 mg/dL   Total CHOL/HDL Ratio 4.8     VLDL 24  0 - 40 mg/dL   LDL Cholesterol 846 (*) 0 - 99 mg/dL   Comment:  Total Cholesterol/HDL:CHD Risk     Coronary Heart Disease Risk Table                         Men   Women      1/2 Average Risk   3.4   3.3      Average Risk       5.0   4.4      2 X Average Risk   9.6   7.1      3 X Average Risk  23.4   11.0                Use the calculated Patient Ratio     above and the CHD Risk Table     to determine the patient's CHD Risk.                ATP III CLASSIFICATION (LDL):      <100     mg/dL   Optimal      086-578  mg/dL   Near or Above                        Optimal      130-159  mg/dL   Borderline      469-629  mg/dL   High      >528     mg/dL   Very High     Performed at [facility]    Physical Findings: AIMS: Facial and Oral Movements Muscles of Facial Expression: None, normal Lips and Perioral Area: None, normal Jaw: None, normal Tongue: None, normal,Extremity Movements Upper (arms, wrists, hands, fingers): None, normal Lower (legs, knees, ankles, toes): None, normal, Trunk Movements Neck, shoulders, hips: None, normal, Overall Severity Severity of abnormal movements (highest score from questions above): None, normal Incapacitation due to abnormal movements: None, normal Patient's awareness of abnormal movements (rate only patient's report): No Awareness, Dental Status Current problems with teeth and/or dentures?: No Does patient usually wear dentures?: No  CIWA:  CIWA-Ar Total: 3 COWS:     Treatment Plan Summary: Daily contact with patient to assess and evaluate symptoms and progress in treatment Medication management  Plan: Supportive approach/coping skills/relapse prevention           Continue the Detox            Optimize treatment with the Tegretol           Did not sleep with his night meds: Will increase the Remeron standing to 30 mg and use the Trazone PRN           Allow to use knee brace  Medical Decision Making Problem Points:  Review of psycho-social stressors (1) Data Points:  Review of medication regiment & side effects (2)  I certify that inpatient services furnished can reasonably be expected to improve the patient's condition.   Mclain Freer A 07/06/2013, 3:24 PM

## 2013-07-06 NOTE — Progress Notes (Signed)
Patient did attend the evening speaker AA meeting.  

## 2013-07-07 MED ORDER — PRAZOSIN HCL 1 MG PO CAPS
1.0000 mg | ORAL_CAPSULE | Freq: Every day | ORAL | Status: DC
  Administered 2013-07-07 – 2013-07-08 (×2): 1 mg via ORAL
  Filled 2013-07-07 (×5): qty 1

## 2013-07-07 MED ORDER — QUETIAPINE FUMARATE 50 MG PO TABS
50.0000 mg | ORAL_TABLET | Freq: Every day | ORAL | Status: DC
  Administered 2013-07-07 – 2013-07-08 (×2): 50 mg via ORAL
  Filled 2013-07-07 (×5): qty 1
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
## 2013-07-07 NOTE — Progress Notes (Signed)
Pt asked for a copy of his medications and writer told pt the medications and they were written down in his book.

## 2013-07-07 NOTE — Progress Notes (Addendum)
D: Pt denies SI/HI/AVH. Pt is pleasant and cooperative.  Pt wife called and stated pt allergic to minipress she said he had a bad experience when he took 4 mg in the past.  A: Pt was offered support and encouragement. Pt was given scheduled medications. Pt was encourage to attend groups. Q 15 minute checks were done for safety.   R:Pt attends groups and interacts well with peers and staff. Pt is taking medication.Pt receptive to treatment and safety maintained on unit.

## 2013-07-07 NOTE — Progress Notes (Signed)
The focus of this group is to help patients review their daily goal of treatment and discuss progress on daily workbooks.  Harold [name] attended the A/A meeting tonight.
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
## 2013-07-07 NOTE — Progress Notes (Signed)
Patient ID: Harold [name], male   DOB: 07-15-1971, 42 y.o.   MRN: [id number]  D: Patient calm on approach today. Reports depression "9" and hopelessness "8" on scales. Reports not feeling any better from admission. Still having SI which he says is on and off but contracts for safety at present. Minimal withdrawals at present. A: Staff will monitor on q 15 minute checks, follow treatment plan, and give meds as ordered. R: Cooperative on unit.
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
## 2013-07-07 NOTE — B[facility] Suicide Risk Assessment (Signed)
[facility] INPATIENT: Family/Significant Other Suicide Prevention Education  Suicide Prevention Education:  Education Completed; No one has been identified by the patient as the family member/significant other with whom the patient will be residing, and identified as the person(s) who will aid the patient in the event of a mental health crisis (suicidal ideations/suicide attempt).   Pt did not c/o SI at admission, nor have they endorsed SI during their stay here. SPE not required. SPI pamphlet provided to pt and he was encouraged to ask questions and talk about concerns relating to SPE.   [employer], LCSWA 07/07/2013 1:00 PM

## 2013-07-08 DIAGNOSIS — F1320 Sedative, hypnotic or anxiolytic dependence, uncomplicated: Secondary | ICD-10-CM

## 2013-07-08 DIAGNOSIS — F322 Major depressive disorder, single episode, severe without psychotic features: Secondary | ICD-10-CM

## 2013-07-08 DIAGNOSIS — F10239 Alcohol dependence with withdrawal, unspecified: Principal | ICD-10-CM

## 2013-07-08 LAB — CARBAMAZEPINE LEVEL, TOTAL: Carbamazepine Lvl: 4.8 ug/mL (ref 4.0–12.0)

## 2013-07-08 MED ORDER — HYDROXYZINE HCL 50 MG PO TABS
50.0000 mg | ORAL_TABLET | Freq: Once | ORAL | Status: AC
  Administered 2013-07-08: 50 mg via ORAL

## 2013-07-08 MED ORDER — HYDROXYZINE HCL 25 MG PO TABS
25.0000 mg | ORAL_TABLET | ORAL | Status: DC | PRN
  Administered 2013-07-08 – 2013-07-09 (×2): 25 mg via ORAL

## 2013-07-08 NOTE — Progress Notes (Signed)
Recreation Therapy Notes  Date: 08.26.2014 Time: 3:00pm Location: 300 Hall Dayroom  Group Topic: Communication, Team Building, Problem Solving  Goal Area(s) Addresses:  Patient will be able to recognize use of communication, team building and problem solving during course of group activity.  Patient will verbalize qualities used to make decisions during group session.  Patient will verbalize ability to use skills to build healthy support system post d/c.   Behavioral Response: Appropriate, Attentive, Engaged  Intervention: Problem Solving Activity.  Activity: Life Boat. Patients were given a scenario about being caught on a sinking ship. In this scenario patients were informed all members of group session, in addition to 8 individuals off of a list of 15 could fit on the life boat. List of individuals included people from all socioeconomic status, for example: President Obama, Midwife, Runner, broadcasting/film/video, Education officer, museum.    Education: LIfe Skills, Discharge Planning   Education Outcome: Acknowledges understanding  Clinical Observations/Feedback: Patient made no contributions to opening or wrap up discussion, but appeared to actively listen as he maintained appropriate eye contact with speaker. Patient participated in group activity, primarily by show of hands when the group was making a decision on who would or would not make it on the life boat. Patient interacted appropriately with peers.     Marykay Lex Adilene Areola, LRT/CTRS  Jennette Leask L 07/08/2013 4:08 PM

## 2013-07-08 NOTE — Progress Notes (Signed)
D: Patient denies HI and A/V hallucinations and thoughts of SI; patient reports sleep is fair; reports appetite is good ; reports energy level is low ; reports ability to pay attention is improving; rates depression as 9/10; rates hopelessness 8/10; rates anxiety as 8/10  A: Monitored q 15 minutes; patient encouraged to attend groups; patient educated about medications; patient given medications per physician orders; patient encouraged to express feelings and/or concerns  R: Patient is appropriate and cooperative; patient reports some anxiety and it is decreasing; ; patient's interaction with staff and peers is appropriate; patient was able to set goal to talk with staff 1:1 when having feelings of SI; patient is taking medications as prescribed and tolerating medications; patient is attending all groups and engaging

## 2013-07-08 NOTE — Progress Notes (Signed)
[facility] MD Progress Note  07/08/2013 5:39 PM Harold [name]  MRN:  [id number] Subjective:  Did sleep better with the minipress last night. Still not where he wants to be with his mood. He heard that he might be able to go back to his house. He will work on better ways to deal with his son's behavior. He is willing to continue to take this combination of medications and see how much more benefit he can get from them. He has been having problems with his knee giving up on him. States he has fallen.  Diagnosis:   DSM5: Schizophrenia Disorders:   Obsessive-Compulsive Disorders:   Trauma-Stressor Disorders:  Posttraumatic Stress Disorder (309.81) Substance/Addictive Disorders:  Alcohol Related Disorder - Moderate (303.90) Depressive Disorders:    Axis I: Mood Disorder NOS  ADL's:  Intact  Sleep: Fair  Appetite:  Fair  Suicidal Ideation:  Plan:  denies Intent:  denies Means:  denies Homicidal Ideation:  Plan:  denies Intent:  denies Means:  denies AEB (as evidenced by):  Psychiatric Specialty Exam: Review of Systems  Constitutional: Negative.   Eyes: Negative.   Respiratory: Negative.   Cardiovascular: Negative.   Gastrointestinal: Negative.   Genitourinary: Negative.   Musculoskeletal: Positive for joint pain.       Knee giving up on him  Skin: Negative.   Neurological: Negative.   Endo/Heme/Allergies: Negative.   Psychiatric/Behavioral: Positive for depression and substance abuse. The patient is nervous/anxious and has insomnia.     Blood pressure 128/72, pulse 89, temperature 98.7 F (37.1 C), temperature source Oral, resp. rate 18, height 6\' 5"  (1.956 m), weight 118.842 kg (262 lb), SpO2 99.00%.Body mass index is 31.06 kg/(m^2).  General Appearance: Fairly Groomed and limping, brace on his knee  Eye Contact::  Fair  Speech:  Clear and Coherent  Volume:  Decreased  Mood:  Anxious and sad, worried  Affect:  Restricted  Thought Process:  Coherent and Goal Directed   Orientation:  Full (Time, Place, and Person)  Thought Content:  worries, concerns  Suicidal Thoughts:  No  Homicidal Thoughts:  No  Memory:  Immediate;   Fair Recent;   Fair Remote;   Fair  Judgement:  Fair  Insight:  Present  Psychomotor Activity:  Restlessness  Concentration:  Fair  Recall:  Fair  Akathisia:  No  Handed:  Right  AIMS (if indicated):     Assets:  Desire for Improvement Housing Social Support  Sleep:  Number of Hours: 5.75   Current Medications: Current Facility-Administered Medications  Medication Dose Route Frequency Provider Last Rate Last Dose  . acetaminophen (TYLENOL) tablet 650 mg  650 mg Oral Q6H PRN Court Joy, PA-C      . alum & mag hydroxide-simeth (MAALOX/MYLANTA) 200-200-20 MG/5ML suspension 30 mL  30 mL Oral Q4H PRN Court Joy, PA-C      . carbamazepine (TEGRETOL XR) 12 hr tablet 200 mg  200 mg Oral BID Rachael Fee, MD   200 mg at 07/08/13 1637  . chlordiazePOXIDE (LIBRIUM) capsule 25 mg  25 mg Oral Once Court Joy, PA-C      . [START ON 07/09/2013] chlordiazePOXIDE (LIBRIUM) capsule 25 mg  25 mg Oral Daily Court Joy, PA-C      . cyclobenzaprine (FLEXERIL) tablet 10 mg  10 mg Oral Daily PRN Court Joy, PA-C   10 mg at 07/07/13 1030  . hydrOXYzine (ATARAX/VISTARIL) tablet 25 mg  25 mg Oral Q4H PRN Sanjuana Kava, NP      .  magnesium hydroxide (MILK OF MAGNESIA) suspension 30 mL  30 mL Oral Daily PRN Court Joy, PA-C      . mirtazapine (REMERON) tablet 30 mg  30 mg Oral QHS Rachael Fee, MD   30 mg at 07/07/13 2139  . multivitamin with minerals tablet 1 tablet  1 tablet Oral Daily Court Joy, PA-C   1 tablet at 07/08/13 0800  . naproxen (NAPROSYN) tablet 500 mg  500 mg Oral Daily Court Joy, PA-C   500 mg at 07/08/13 0800  . prazosin (MINIPRESS) capsule 1 mg  1 mg Oral QHS Sanjuana Kava, NP   1 mg at 07/07/13 2139  . QUEtiapine (SEROQUEL) tablet 50 mg  50 mg Oral QHS Sanjuana Kava, NP   50 mg at 07/07/13 2139   . tiZANidine (ZANAFLEX) tablet 4 mg  4 mg Oral QHS Court Joy, PA-C   4 mg at 07/07/13 2139  . traMADol (ULTRAM) tablet 50 mg  50 mg Oral BID PRN Court Joy, PA-C   50 mg at 07/07/13 1711  . venlafaxine XR (EFFEXOR-XR) 24 hr capsule 225 mg  225 mg Oral QHS Rachael Fee, MD   225 mg at 07/07/13 2139    Lab Results:  Results for orders placed during the hospital encounter of 07/04/13 (from the past 48 hour(s))  CARBAMAZEPINE LEVEL, TOTAL     Status: None   Collection Time    07/08/13  6:05 AM      Result Value Range   Carbamazepine Lvl 4.8  4.0 - 12.0 ug/mL   Comment: Performed at [facility]    Physical Findings: AIMS: Facial and Oral Movements Muscles of Facial Expression: None, normal Lips and Perioral Area: None, normal Jaw: None, normal Tongue: None, normal,Extremity Movements Upper (arms, wrists, hands, fingers): None, normal Lower (legs, knees, ankles, toes): None, normal, Trunk Movements Neck, shoulders, hips: None, normal, Overall Severity Severity of abnormal movements (highest score from questions above): None, normal Incapacitation due to abnormal movements: None, normal Patient's awareness of abnormal movements (rate only patient's report): No Awareness, Dental Status Current problems with teeth and/or dentures?: No Does patient usually wear dentures?: No  CIWA:  CIWA-Ar Total: 5 COWS:     Treatment Plan Summary: Daily contact with patient to assess and evaluate symptoms and progress in treatment Medication management  Plan: Supportive approach/coping skills/relapse prevention           Continue Librium Detox protocol           Continue to evaluate and optimize treatment with psychotropics  Medical Decision Making Problem Points:  Review of psycho-social stressors (1) Data Points:  Review of medication regiment & side effects (2)  I certify that inpatient services furnished can reasonably be expected to improve the patient's condition.    Khaiden Segreto A 07/08/2013, 5:39 PM

## 2013-07-08 NOTE — Progress Notes (Signed)
Recreation Therapy Notes  Date: 08.26.2014 Time: 2:30pm Location: 300 Hall Dayroom  Group Topic: Animal Assisted Activities  Goal Area(s) Addresses:  Patient will interact appropriately with dog team.    Behavioral Response: Appropriate, Attentive, Engaged  Education: Coping Skill   Education Outcome: Acknowledges understanding  Clinical Observations/Feedback: Dog Team: [employer]. Patient interacted appropriately with peer, dog team and LRT.    Marykay Lex Keonta Monceaux, LRT/CTRS  Jearl Klinefelter 07/08/2013 4:38 PM

## 2013-07-08 NOTE — Progress Notes (Signed)
The focus of this group is to educate the patient on the purpose and policies of crisis stabilization and provide a format to answer questions about their admission.  The group details unit policies and expectations of patients while admitted. Patient was engaging.

## 2013-07-08 NOTE — Progress Notes (Signed)
D:  Patient fall occurred at 0605.  Patient alert and oriented X 4 prior to and after fall.  Patient states that he was getting out of bed and his R knee "gave out."  No change in range of motion.  No redness, scarring, or bruising noted.  No distress noted by patient.  Pt vitals 97.4, 97% (RA), 91, 16, 118/85.  Donell Sievert, PA notified about fall.  A: Cane ordered to assist with ambulation.  Red socks placed on patient.  Yellow arm band placed on patient.  Pt re-educated about fall prevention.  R: Pt states understanding.  Pt safe on unit.  No other fall occurred.

## 2013-07-08 NOTE — Progress Notes (Addendum)
Patient reported that he knee gave out and he had to catch himself around 1545; patient vital signs were taken and they can be seen in doc flowsheets; patient had no other complaints; charge nurse, nurse practitioner and the physician was made aware. Patient was left in the bed with the right knee elevated. Will continue to monitor patient as instructed

## 2013-07-09 MED ORDER — NAPROXEN 500 MG PO TABS: 500.0000 mg | ORAL_TABLET | Freq: Every day | ORAL | Status: AC

## 2013-07-09 MED ORDER — CARBAMAZEPINE ER 200 MG PO TB12: 200.0000 mg | ORAL_TABLET | Freq: Two times a day (BID) | ORAL | Status: AC

## 2013-07-09 MED ORDER — TRAMADOL HCL 50 MG PO TABS: 50.0000 mg | ORAL_TABLET | Freq: Two times a day (BID) | ORAL | Status: AC | PRN

## 2013-07-09 MED ORDER — HYDROXYZINE HCL 25 MG PO TABS: 25.0000 mg | ORAL_TABLET | ORAL | Status: AC | PRN

## 2013-07-09 MED ORDER — VENLAFAXINE HCL ER 75 MG PO CP24: 225.0000 mg | ORAL_CAPSULE | Freq: Every day | ORAL | Status: AC

## 2013-07-09 MED ORDER — TIZANIDINE HCL 4 MG PO TABS: 4.0000 mg | ORAL_TABLET | Freq: Every day | ORAL | Status: AC

## 2013-07-09 MED ORDER — ADULT MULTIVITAMIN W/MINERALS CH: 1.0000 | ORAL_TABLET | Freq: Every day | ORAL | Status: AC

## 2013-07-09 MED ORDER — QUETIAPINE FUMARATE 50 MG PO TABS: 50.0000 mg | ORAL_TABLET | Freq: Every day | ORAL | Status: AC

## 2013-07-09 MED ORDER — CYCLOBENZAPRINE HCL 10 MG PO TABS: 10.0000 mg | ORAL_TABLET | Freq: Every day | ORAL | Status: AC | PRN

## 2013-07-09 MED ORDER — OMEGA-3 FATTY ACIDS 1000 MG PO CAPS: 1.0000 g | ORAL_CAPSULE | Freq: Every day | ORAL | Status: AC

## 2013-07-09 MED ORDER — PRAZOSIN HCL 1 MG PO CAPS: 1.0000 mg | ORAL_CAPSULE | Freq: Every day | ORAL | Status: AC

## 2013-07-09 MED ORDER — MIRTAZAPINE 30 MG PO TABS: 30.0000 mg | ORAL_TABLET | Freq: Every day | ORAL | Status: AC

## 2013-07-09 NOTE — Clinical Social Work Note (Signed)
CSW met with pt's wife prior to pt being discharged to discuss appts scheduled at [facility]. CSW reiterated that there may not be bed open and to call prior to driving to [place] in order to check for bed availability if she decides that she wants pt to be admitted i/p. If not, Pt has three upcoming appts for follow up/therapy/med management. CSW reviewed SPE with pt and pt's wife again and encouraged them to ask questions and talk about any concerns. Pt's wife and CSW went through discharge packet to discuss medications and follow up.

## 2013-07-09 NOTE — Progress Notes (Signed)
D: Patient denies HI/AVH.  Pt endorses SI but agrees to contract.  Patient affect is depressed. Mood is depressed.  Pt states, "I get dizzy every now and then.  My mind does not work the way that it used to."   Patient did attend evening group. Patient visible on the milieu. Pt given a wheelchair to assist with mobility, due to an unstable R knee.  A: Support and encouragement offered. Scheduled medications given to pt. Q 15 min checks continued for patient safety. No falls noted on this shift.  R: Patient receptive. Patient remains safe on the unit.

## 2013-07-09 NOTE — Progress Notes (Signed)
Number for the patient received from the case manager Herbert Seta; number was given to the patient by the mental health tech and patient verbalized that he did receive the number and that he had called his wife at the number and she spoke with him.

## 2013-07-09 NOTE — BHH Group Notes (Signed)
[facility] LCSW Aftercare Discharge Planning Group Note   07/09/2013 9:26 AM  Participation Quality:  Appropriate   Mood/Affect:  Appropriate  Depression Rating:  7  Anxiety Rating:  8  Thoughts of Suicide:  No Will you contract for safety?   NA  Current AVH:  No  Plan for Discharge/Comments:  Pt reports that he fell three times yesterday and is now in a wheelchair. He stated that his wife has taken over d/c plans being that she is POA over him. Pt stated that he is "fine" with being transported to [place] in [place] if that is the plan for him today. CSW to verify d/c plans with MD during treatment team today.   Transportation Means: wife?  Supports: wife/family/case manager-Jeanette.   Smart, [employer]

## 2013-07-09 NOTE — Progress Notes (Signed)
Spoke with the case manager about if patient was being officially transferred or discharged because instructed to do so by [facility] and physician stated that the case manager would handle all this; case manager reported to this Clinical research associate that patient is being discharged to his wife care and not transferred;  therefore no report is needed; charge nurse was made aware of this and no further instruction given to do otherwise by supervisor and  that there is the possibility of a bed but that there is no guarantee of a bed space when he arrives and that the receiving facility will do otherwise

## 2013-07-09 NOTE — Tx Team (Signed)
Interdisciplinary Treatment Plan Update (Adult)  Date: 07/09/2013   Time Reviewed: 10:36 AM  Progress in Treatment:  Attending groups: Yes Participating in groups:  Yes Taking medication as prescribed: Yes  Tolerating medication: Yes  Family/Significant othe contact made: SPE completed with pt. CSW to contact pt's wife for SPE.  Patient understands diagnosis: Yes, AEB seeking treatment for ETOH detox, mood stabilization, and SI.  Discussing patient identified problems/goals with staff: Yes  Medical problems stabilized or resolved: Yes  Denies suicidal/homicidal ideation: Yes in group/self report.  Patient has not harmed self or Others: Yes  New problem(s) identified: Pt reports falling 3 times and was given wheelchair to avoid further fall from imbalance/dizziness.  Discharge Plan or Barriers: Pt's wife is POA and is wanting for pt to be transferred to [place] facility today. Transfer forms faxed to [place]. CSW working with CM at [place] to confirm bed availability. Pt has appts scheduled at [facility]. Wife to pick up pt at [facility] today for transport.  Additional comments: Reason for Continuation of Hospitalization: d/c today.  Estimated length of stay: d/c today For review of initial/current patient goals, please see plan of care.  Attendees:  Patient:    Family:    Physician: Dr. Dub Mikes MD 07/09/2013 10:36 AM   Nursing: Philippa Chester RN  07/09/2013 10:36 AM   Clinical Social Worker Semone Orlov Smart, LCSWA  07/09/2013 10:36 AM   Other: Lupita Leash RN 07/09/2013 10:36 AM   Other: Aggie PA 07/09/2013 10:36 AM   Other: Darden Dates Nurse CM 07/09/2013 10:36 AM   Other:    Scribe for Treatment Team:  [employer] LCSWA 07/09/2013 10:37 AM

## 2013-07-09 NOTE — Progress Notes (Signed)
Adult Psychoeducational Group Note  Date:  07/09/2013 Time:  12:10 PM  Group Topic/Focus:  Crisis Planning:   The purpose of this group is to help patients create a crisis plan for use upon discharge or in the future, as needed.  Participation Level:  Active  Participation Quality:  Appropriate  Affect:  Appropriate  Cognitive:  Appropriate  Insight: Appropriate  Engagement in Group:  Engaged  Modes of Intervention:  Support  Additional Comments: Patient created Crisis Plan   Lauralee Evener 07/09/2013, 12:10 PM

## 2013-07-09 NOTE — BHH Group Notes (Signed)
[facility] LCSW Group Therapy  07/09/2013 3:13 PM  Type of Therapy:  Group Therapy  Participation Level:  Did Not Attend  [name], Harold Schroeck 07/09/2013, 3:13 PM

## 2013-07-09 NOTE — Progress Notes (Signed)
Patient ID: Harold [name], male   DOB: [birth year], 42 y.o.   MRN: [id number] Patient discharged per physician order; patient given copy of AVS after it was reviewed by another nurse Lupita Leash RN; patient had no other questions or concerns at this time; patient left the unit ambulatory with his wife after he was walked out by another nurse Lupita Leash RN
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
## 2013-07-09 NOTE — Clinical Social Work Note (Signed)
CSW spoke with pt's wife, Marylene Land this morning. Marylene Land is pt's POA and stated that she would like pt to be transported to [facility] for further i/p care. Transfer forms given to Dr. Dub Mikes and faxed to [facility]. CSW called [facility] to verify bed availability. Currently one bed available but was told this could change any time. CSW notified wife and told her that bed may not be available by this afternoon if she chooses to transfer him there after d/c. Explained to pt's wife that typically, the pt will stay at the hospital until bed becomes available on 9th floor (long term treatment at [facility]). She verified understanding and stated that if there was not bed available, her CM Para March would assist with getting pt into bed at different [place] facility and stated that she still wanted pt discharged from [facility]. CSW contacted Para March to verify this. Marylene Land will come at [facility] to pick up pt. His appts are still in place at [place] if she decides to take him home. Marylene Land requested that CSW have pt call her at work so that she can tell him the news. CSW provided her number to pt's nurse Brittney.

## 2013-07-09 NOTE — Progress Notes (Signed)
[facility] Adult Case Management Discharge Plan :  Will you be returning to the same living situation after discharge: No. At discharge, do you have transportation home?:Yes,  wife Do you have the ability to pay for your medications:Yes,  [facility]  Release of information consent forms completed and in the chart;  Patient's signature needed at discharge.  Patient to Follow up at: Follow-up Information   Follow up with [facility] On 07/10/2013. (Appt. with Judie Grieve for hospital followup at 10:00AM)    Contact information:   [address], [place] [address] Phone: [phone] Fax:        Follow up with [facility] On 08/16/2013. (Appt with Deetta Perla for substance abuse group/intake)    Contact information:   [address] [place], [place] [address] Phone: [phone] Fax: [phone]      Patient denies SI/HI:   Yes,  during group/self report    Safety Planning and Suicide Prevention discussed:  Yes,  SPE completed with pt and pt's wife. Pt provided with SPI pamphlet and encouraged to share this with his wife and support system.   Smart, Megan Hayduk 07/09/2013, 11:06 AM

## 2013-07-09 NOTE — [facility] Suicide Risk Assessment (Signed)
[facility] INPATIENT:  Family/Significant Other Suicide Prevention Education  Suicide Prevention Education:  Education Completed; Dontavious Emily has been identified by the patient as the family member/significant other with whom the patient will be residing, and identified as the person(s) who will aid the patient in the event of a mental health crisis (suicidal ideations/suicide attempt).  With written consent from the patient, the family member/significant other has been provided the following suicide prevention education, prior to the and/or following the discharge of the patient.  The suicide prevention education provided includes the following:  Suicide risk factors  Suicide prevention and interventions  National Suicide Hotline telephone number  [facility] assessment telephone number  [facility] Emergency Assistance 911  [facility] and/or Residential Mobile Crisis Unit telephone number  Request made of family/significant other to:  Remove weapons (e.g., guns, rifles, knives), all items previously/currently identified as safety concern.    Remove drugs/medications (over-the-counter, prescriptions, illicit drugs), all items previously/currently identified as a safety concern.  The family member/significant other verbalizes understanding of the suicide prevention education information provided.  The family member/significant other agrees to remove the items of safety concern listed above.  Smart, Yohanna Tow 07/09/2013, 11:07 AM

## 2013-07-11 NOTE — Progress Notes (Signed)
Patient Discharge Instructions:  After Visit Summary (AVS):   Faxed to:  07/11/13 Discharge Summary Note:   Faxed to:  07/11/13 Psychiatric Admission Assessment Note:   Faxed to:  07/11/13 Suicide Risk Assessment - Discharge Assessment:   Faxed to:  07/11/13 Faxed/Sent to the Next Level Care provider:  07/11/13 Faxed to [facility] PCP @ [phone] Faxed to [facility] @ [phone] Faxed to [facility] @ [phone]  Jerelene Redden, 07/11/2013, 4:50 PM

## 2013-08-05 ENCOUNTER — Emergency Department: Payer: Self-pay | Admitting: Emergency Medicine

## 2013-08-05 LAB — URINALYSIS, COMPLETE
Bilirubin,UR: NEGATIVE
Blood: NEGATIVE
Glucose,UR: NEGATIVE mg/dL (ref 0–75)
Leukocyte Esterase: NEGATIVE
Squamous Epithelial: NONE SEEN

## 2013-08-05 LAB — ETHANOL: Ethanol: <3 mg/dL

## 2013-08-05 LAB — CBC
MCHC: 34.8 g/dL (ref 32.0–36.0)
RDW: 13.1 % (ref 11.5–14.5)
WBC: 10.7 10*3/uL — ABNORMAL HIGH (ref 3.8–10.6)

## 2013-08-05 LAB — COMPREHENSIVE METABOLIC PANEL
Albumin: 4.2 g/dL (ref 3.4–5.0)
Calcium, Total: 9.3 mg/dL (ref 8.5–10.1)
Chloride: 107 mmol/L (ref 98–107)
Co2: 30 mmol/L (ref 21–32)
Creatinine: 1.04 mg/dL (ref 0.60–1.30)
Glucose: 124 mg/dL — ABNORMAL HIGH (ref 65–99)
Osmolality: 284 (ref 275–301)
SGOT(AST): 22 U/L (ref 15–37)
SGPT (ALT): 33 U/L (ref 12–78)
Sodium: 142 mmol/L (ref 136–145)
Total Protein: 7.6 g/dL (ref 6.4–8.2)

## 2013-08-05 LAB — DRUG SCREEN, URINE
Benzodiazepine, Ur Scrn: NEGATIVE (ref ?–200)
MDMA (Ecstasy)Ur Screen: NEGATIVE (ref ?–500)
Phencyclidine (PCP) Ur S: NEGATIVE (ref ?–25)
Tricyclic, Ur Screen: NEGATIVE (ref ?–1000)

## 2013-08-09 ENCOUNTER — Inpatient Hospital Stay: Payer: Self-pay | Admitting: Psychiatry

## 2013-08-09 LAB — DRUG SCREEN, URINE
Amphetamines, Ur Screen: NEGATIVE (ref ?–1000)
Cannabinoid 50 Ng, Ur ~~LOC~~: NEGATIVE (ref ?–50)
Cocaine Metabolite,Ur ~~LOC~~: NEGATIVE (ref ?–300)
MDMA (Ecstasy)Ur Screen: NEGATIVE (ref ?–500)
Methadone, Ur Screen: NEGATIVE (ref ?–300)
Phencyclidine (PCP) Ur S: NEGATIVE (ref ?–25)

## 2013-08-09 LAB — URINALYSIS, COMPLETE
Bacteria: NONE SEEN
Glucose,UR: NEGATIVE mg/dL (ref 0–75)
Ketone: NEGATIVE
Leukocyte Esterase: NEGATIVE
Nitrite: NEGATIVE
Ph: 6 (ref 4.5–8.0)
RBC,UR: <1 /HPF (ref 0–5)
Squamous Epithelial: NONE SEEN

## 2013-08-09 LAB — CBC
HCT: 43.2 % (ref 40.0–52.0)
HGB: 14.8 g/dL (ref 13.0–18.0)
MCH: 30 pg (ref 26.0–34.0)
MCHC: 34.4 g/dL (ref 32.0–36.0)
Platelet: 160 10*3/uL (ref 150–440)
RDW: 13 % (ref 11.5–14.5)
WBC: 6.4 10*3/uL (ref 3.8–10.6)

## 2013-08-09 LAB — COMPREHENSIVE METABOLIC PANEL
Albumin: 4.1 g/dL (ref 3.4–5.0)
BUN: 9 mg/dL (ref 7–18)
Calcium, Total: 9.1 mg/dL (ref 8.5–10.1)
Osmolality: 281 (ref 275–301)
Total Protein: 7.4 g/dL (ref 6.4–8.2)

## 2013-08-09 LAB — ETHANOL
Ethanol %: <0.003 % (ref 0.000–0.080)
Ethanol: <3 mg/dL

## 2013-08-09 LAB — TSH: Thyroid Stimulating Horm: 1.8 u[IU]/mL

## 2013-08-09 LAB — ACETAMINOPHEN LEVEL: Acetaminophen: <2 ug/mL

## 2013-08-09 LAB — SALICYLATE LEVEL: Salicylates, Serum: <1.7 mg/dL

## 2013-10-04 ENCOUNTER — Inpatient Hospital Stay: Payer: Self-pay | Admitting: Internal Medicine

## 2013-10-04 LAB — CBC
HCT: 48.1 % (ref 40.0–52.0)
HGB: 15.5 g/dL (ref 13.0–18.0)
MCH: 29.6 pg (ref 26.0–34.0)
Platelet: 165 10*3/uL (ref 150–440)
RDW: 13 % (ref 11.5–14.5)
WBC: 16.4 10*3/uL — ABNORMAL HIGH (ref 3.8–10.6)

## 2013-10-04 LAB — COMPREHENSIVE METABOLIC PANEL
Anion Gap: 22 — ABNORMAL HIGH (ref 7–16)
Calcium, Total: 9.5 mg/dL (ref 8.5–10.1)
Co2: 13 mmol/L — ABNORMAL LOW (ref 21–32)
Creatinine: 1.57 mg/dL — ABNORMAL HIGH (ref 0.60–1.30)
EGFR (African American): >60
EGFR (Non-African Amer.): 53 — ABNORMAL LOW
Glucose: 138 mg/dL — ABNORMAL HIGH (ref 65–99)
Potassium: 2.9 mmol/L — ABNORMAL LOW (ref 3.5–5.1)
SGOT(AST): 18 U/L (ref 15–37)
SGPT (ALT): 22 U/L (ref 12–78)
Sodium: 143 mmol/L (ref 136–145)

## 2013-10-04 LAB — URINALYSIS, COMPLETE
Bilirubin,UR: NEGATIVE
Blood: NEGATIVE
Glucose,UR: 50 mg/dL (ref 0–75)
Leukocyte Esterase: NEGATIVE
Nitrite: NEGATIVE
Specific Gravity: 1.027 (ref 1.003–1.030)

## 2013-10-04 LAB — ETHANOL
Ethanol %: <0.003 % (ref 0.000–0.080)
Ethanol: <3 mg/dL

## 2013-10-04 LAB — DRUG SCREEN, URINE
Benzodiazepine, Ur Scrn: NEGATIVE (ref ?–200)
Cannabinoid 50 Ng, Ur ~~LOC~~: NEGATIVE (ref ?–50)
Cocaine Metabolite,Ur ~~LOC~~: NEGATIVE (ref ?–300)
MDMA (Ecstasy)Ur Screen: NEGATIVE (ref ?–500)
Opiate, Ur Screen: NEGATIVE (ref ?–300)

## 2013-10-04 LAB — BASIC METABOLIC PANEL
Anion Gap: 8 (ref 7–16)
BUN: 8 mg/dL (ref 7–18)
Calcium, Total: 8.2 mg/dL — ABNORMAL LOW (ref 8.5–10.1)
Chloride: 109 mmol/L — ABNORMAL HIGH (ref 98–107)
Co2: 26 mmol/L (ref 21–32)
Creatinine: 1.08 mg/dL (ref 0.60–1.30)
Glucose: 105 mg/dL — ABNORMAL HIGH (ref 65–99)
Potassium: 2.9 mmol/L — ABNORMAL LOW (ref 3.5–5.1)

## 2013-10-04 LAB — SALICYLATE LEVEL: Salicylates, Serum: 2.4 mg/dL

## 2013-10-05 ENCOUNTER — Ambulatory Visit: Payer: Self-pay | Admitting: Urology

## 2013-10-05 LAB — COMPREHENSIVE METABOLIC PANEL
Albumin: 3.5 g/dL (ref 3.4–5.0)
Alkaline Phosphatase: 54 U/L
Anion Gap: 3 — ABNORMAL LOW (ref 7–16)
BUN: 6 mg/dL — ABNORMAL LOW (ref 7–18)
Chloride: 114 mmol/L — ABNORMAL HIGH (ref 98–107)
Creatinine: 1.13 mg/dL (ref 0.60–1.30)
Glucose: 90 mg/dL (ref 65–99)
Osmolality: 284 (ref 275–301)
SGOT(AST): 22 U/L (ref 15–37)
SGPT (ALT): 19 U/L (ref 12–78)
Total Protein: 6.3 g/dL — ABNORMAL LOW (ref 6.4–8.2)

## 2013-10-05 LAB — CBC WITH DIFFERENTIAL/PLATELET
Basophil #: 0.1 10*3/uL (ref 0.0–0.1)
Basophil %: 0.9 %
Eosinophil %: 0.7 %
HGB: 13.5 g/dL (ref 13.0–18.0)
Lymphocyte #: 1.9 10*3/uL (ref 1.0–3.6)
MCH: 30 pg (ref 26.0–34.0)
MCHC: 34.5 g/dL (ref 32.0–36.0)
MCV: 87 fL (ref 80–100)
Monocyte #: 1 x10 3/mm (ref 0.2–1.0)
Monocyte %: 10.8 %
Neutrophil #: 6.4 10*3/uL (ref 1.4–6.5)
RBC: 4.49 10*6/uL (ref 4.40–5.90)

## 2013-10-05 LAB — MAGNESIUM: Magnesium: 1.8 mg/dL

## 2013-10-06 LAB — BASIC METABOLIC PANEL
Anion Gap: 2 — ABNORMAL LOW (ref 7–16)
Calcium, Total: 8.4 mg/dL — ABNORMAL LOW (ref 8.5–10.1)
Co2: 29 mmol/L (ref 21–32)
Creatinine: 1.06 mg/dL (ref 0.60–1.30)
EGFR (African American): >60
EGFR (Non-African Amer.): >60
Osmolality: 279 (ref 275–301)
Potassium: 4 mmol/L (ref 3.5–5.1)

## 2013-10-06 LAB — URINE CULTURE

## 2013-10-09 LAB — CULTURE, BLOOD (SINGLE)

## 2013-11-29 ENCOUNTER — Observation Stay: Payer: Self-pay | Admitting: Internal Medicine

## 2013-11-29 LAB — COMPREHENSIVE METABOLIC PANEL
AST: 19 U/L (ref 15–37)
Albumin: 3.6 g/dL (ref 3.4–5.0)
Alkaline Phosphatase: 59 U/L
Anion Gap: 3 — ABNORMAL LOW (ref 7–16)
BUN: 11 mg/dL (ref 7–18)
Bilirubin,Total: 0.3 mg/dL (ref 0.2–1.0)
Calcium, Total: 8.6 mg/dL (ref 8.5–10.1)
Chloride: 108 mmol/L — ABNORMAL HIGH (ref 98–107)
Co2: 28 mmol/L (ref 21–32)
Creatinine: 0.91 mg/dL (ref 0.60–1.30)
EGFR (African American): >60
Glucose: 99 mg/dL (ref 65–99)
Osmolality: 277 (ref 275–301)
POTASSIUM: 3.7 mmol/L (ref 3.5–5.1)
SGPT (ALT): 19 U/L (ref 12–78)
Sodium: 139 mmol/L (ref 136–145)
TOTAL PROTEIN: 6.7 g/dL (ref 6.4–8.2)

## 2013-11-29 LAB — CBC
HCT: 45.6 % (ref 40.0–52.0)
HGB: 15.2 g/dL (ref 13.0–18.0)
MCH: 29.2 pg (ref 26.0–34.0)
MCHC: 33.3 g/dL (ref 32.0–36.0)
MCV: 88 fL (ref 80–100)
Platelet: 122 10*3/uL — ABNORMAL LOW (ref 150–440)
RBC: 5.18 10*6/uL (ref 4.40–5.90)
RDW: 13.1 % (ref 11.5–14.5)
WBC: 6.5 10*3/uL (ref 3.8–10.6)

## 2013-11-29 LAB — TROPONIN I
Troponin-I: <0.02 ng/mL
Troponin-I: <0.02 ng/mL

## 2013-11-29 LAB — CK-MB
CK-MB: 0.8 ng/mL (ref 0.5–3.6)
CK-MB: 1 ng/mL (ref 0.5–3.6)
CK-MB: 0.8 ng/mL (ref 0.5–3.6)

## 2013-11-30 LAB — LIPID PANEL
CHOLESTEROL: 149 mg/dL (ref 0–200)
HDL: 41 mg/dL (ref 40–60)
Ldl Cholesterol, Calc: 91 mg/dL (ref 0–100)
Triglycerides: 84 mg/dL (ref 0–200)
VLDL CHOLESTEROL, CALC: 17 mg/dL (ref 5–40)

## 2014-07-05 ENCOUNTER — Emergency Department: Payer: Self-pay | Admitting: Emergency Medicine

## 2014-11-21 IMAGING — CT CT ABD-PELV W/ CM
2 of 5 series · 17 of 46 positions shown, 19 images · IV contrast (agent unspecified)
Comparison: None.

CLINICAL DATA: Sepsis.  Pyuria.

EXAM:
CT ABDOMEN AND PELVIS WITH CONTRAST
TECHNIQUE: Multidetector CT imaging of the abdomen and pelvis was performed
using the standard protocol following bolus administration of
intravenous contrast.
CONTRAST:  100 cc Wsovue-3XX

[Series 2: routine abd pel with · axial · 0.82mm/px · z∈[-514,-58]mm · 14 of 103 slices shown, 16 images]
[im 6/103  soft-tissue]
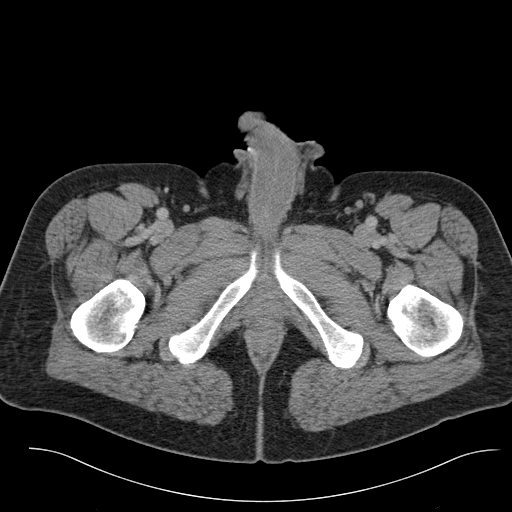
[im 6/103  bone]
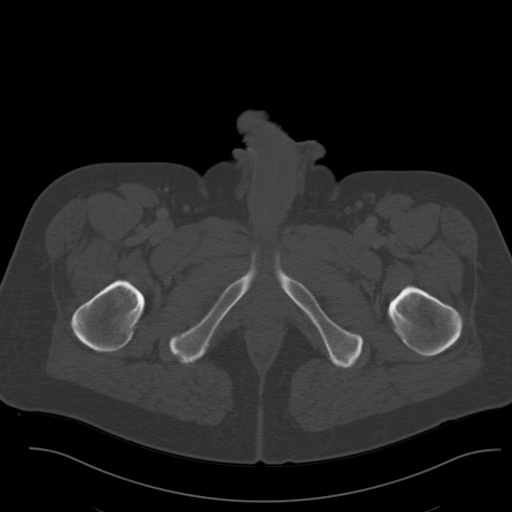
[im 11/103  soft-tissue]
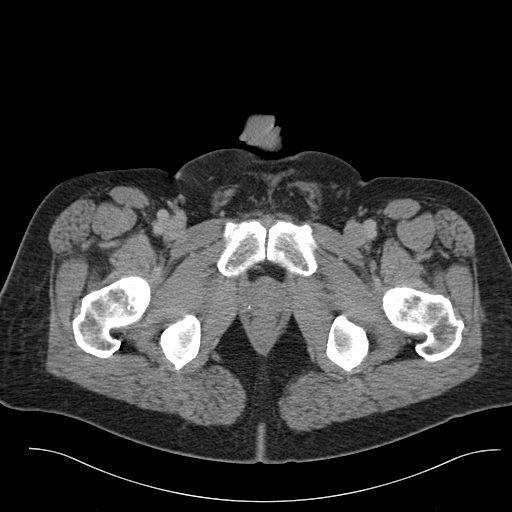
[im 22/103  soft-tissue]
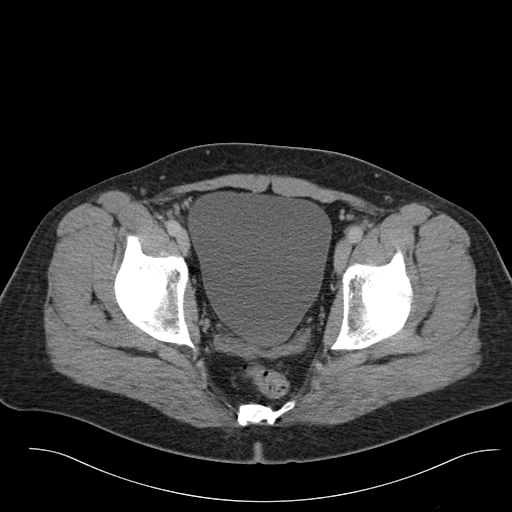
[im 27/103  soft-tissue]
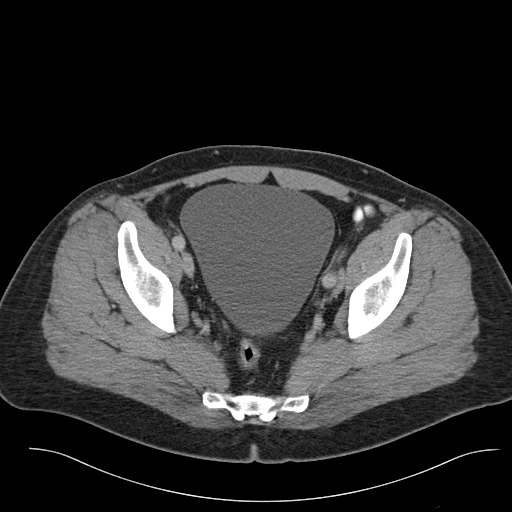
[im 33/103  soft-tissue]
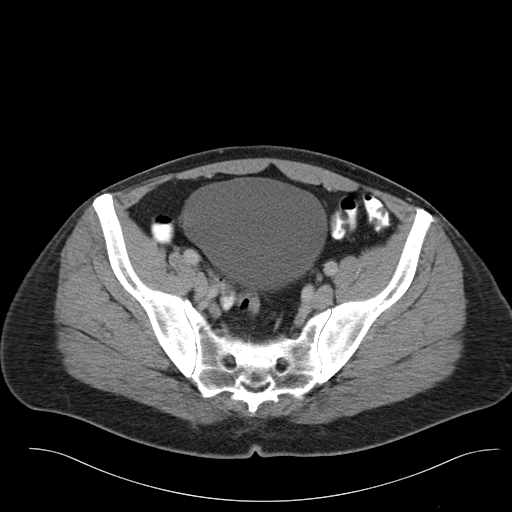
[im 43/103  soft-tissue]
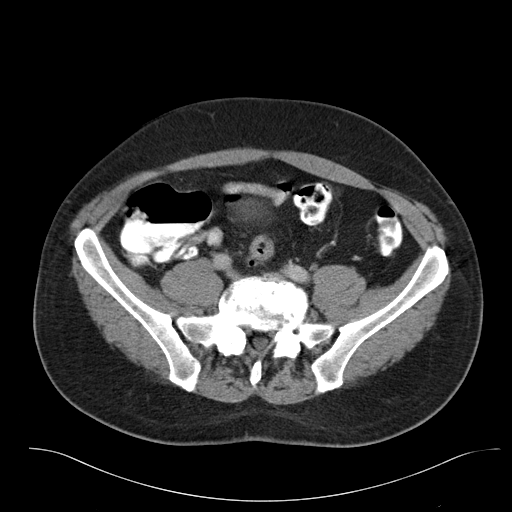
[im 49/103  soft-tissue]
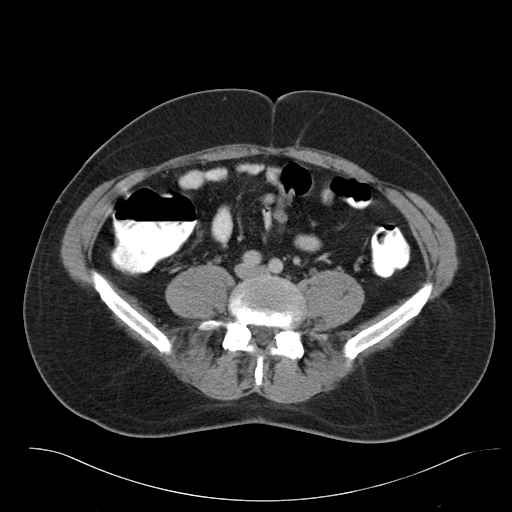
[im 54/103  soft-tissue]
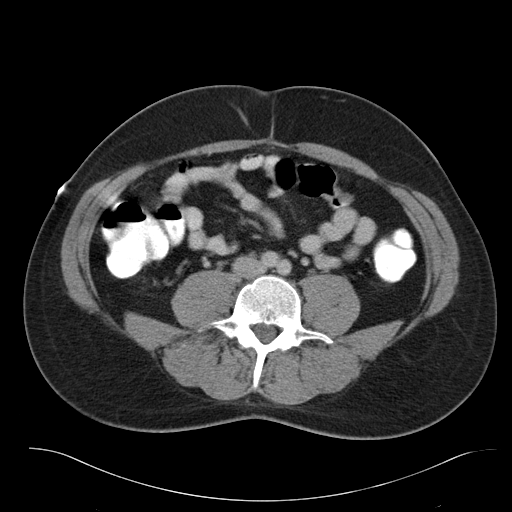
[im 60/103  soft-tissue]
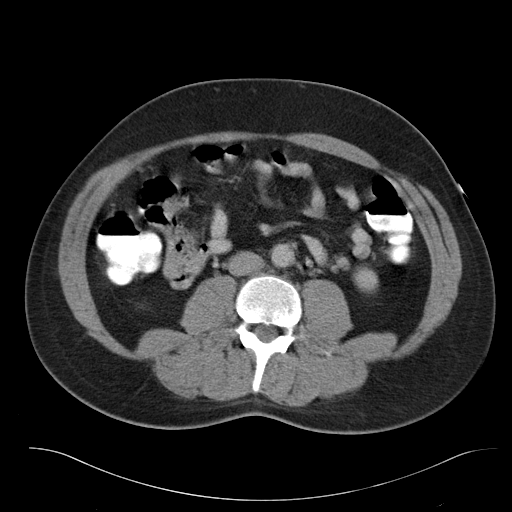
[im 60/103  bone]
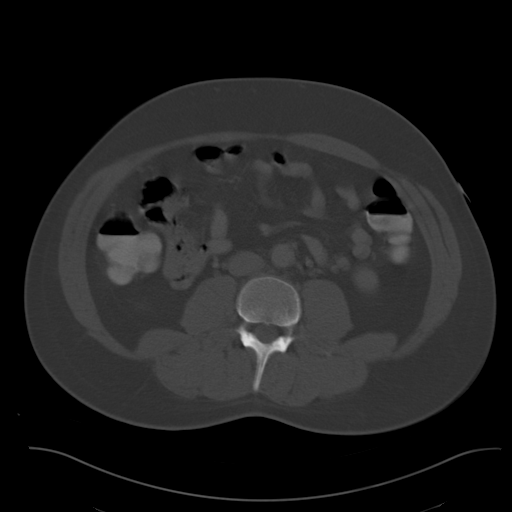
[im 70/103  soft-tissue]
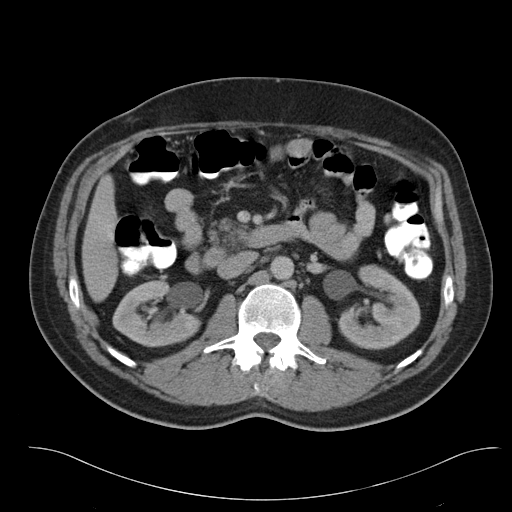
[im 76/103  soft-tissue]
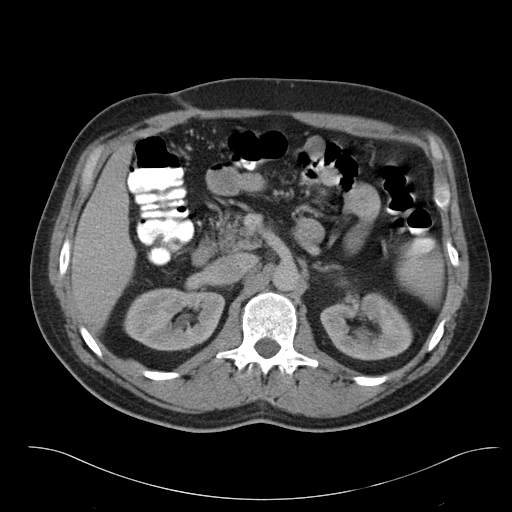
[im 81/103  soft-tissue]
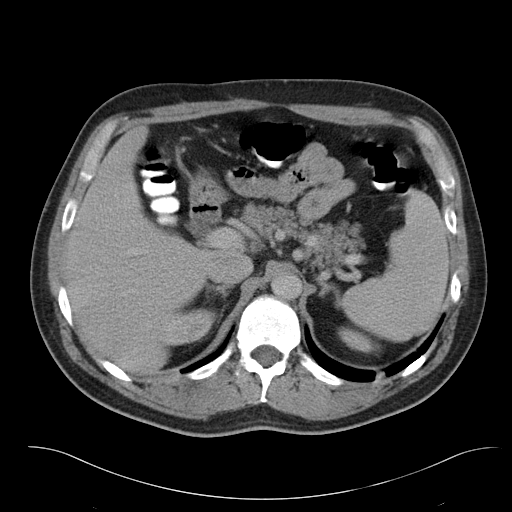
[im 92/103  soft-tissue]
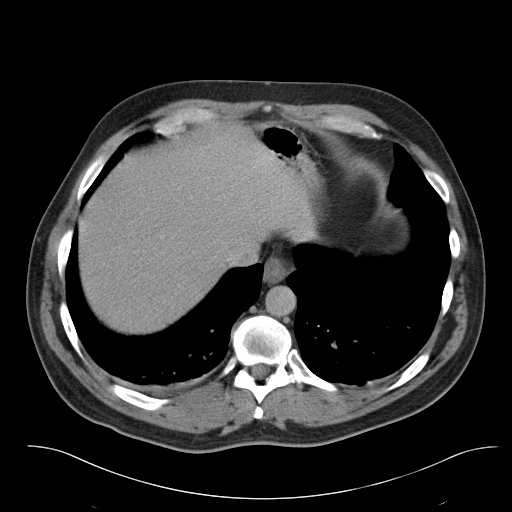
[im 97/103  soft-tissue]
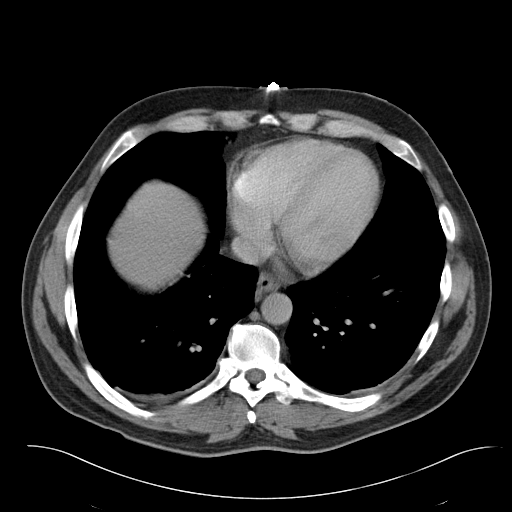

[Series 6: cor routine abd pel with · coronal · 0.84mm/px · 3 of 148 slices shown]
[im 50/148  soft-tissue]
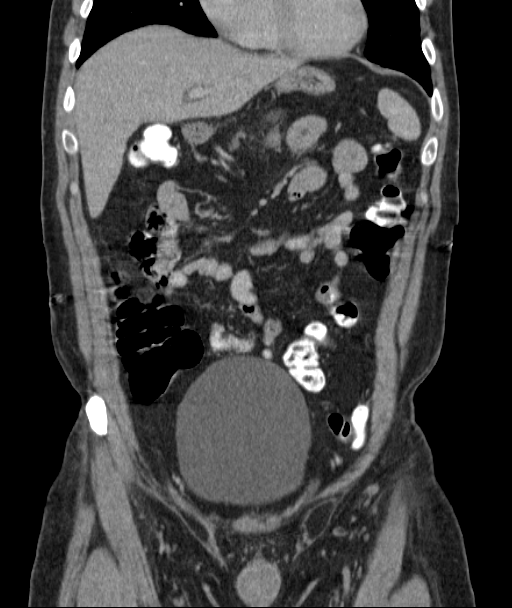
[im 66/148  soft-tissue]
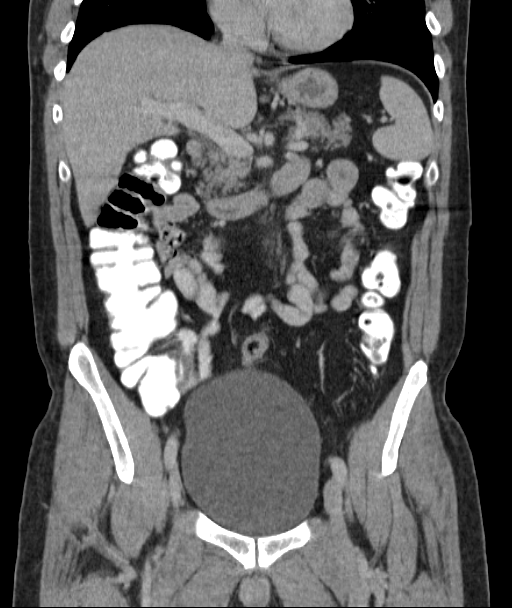
[im 82/148  soft-tissue]
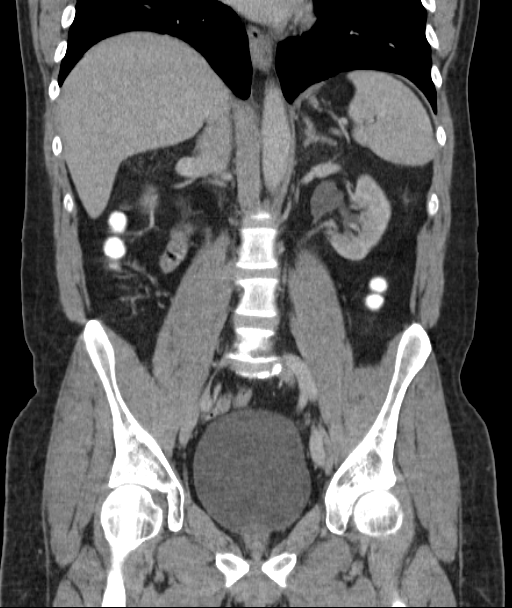

[17 of 46 positions shown; findings below may reference images not displayed]

FINDINGS: BODY WALL: Unremarkable.

LOWER CHEST: Mild dependent atelectasis bilaterally.

ABDOMEN/PELVIS:

Liver: Lobulated 2.4 cm circumscribed low-attenuation lesion in the
medial segment left lobe liver, most consistent with cyst. High
attenuation around the left lateral wall is likely a traversing
vessel rather than enhancing nodule.

Biliary: Cholecystectomy.

Pancreas: Unremarkable.

Spleen: Unremarkable.

Adrenals: Unremarkable.

Kidneys and ureters: Fullness the bilateral urinary collecting
systems without visible obstruction, likely from urinary bladder
distention and extrarenal pelves.

Bladder: Moderately distended.

Reproductive: Unremarkable.

Bowel: No obstruction. Appendectomy.

Retroperitoneum: No mass or adenopathy.

Peritoneum: No free fluid or gas.

Vascular: No acute abnormality.

OSSEOUS: Focally advanced degenerative disc disease at L5-S1, with
mild retrolisthesis.
IMPRESSION: 1. No evidence of intra-abdominal infection.
2. Urinary bladder distention.

## 2014-11-22 IMAGING — CR DG CHEST 1V
1 series · 1 of 1 positions shown · non-contrast
Comparison: October 04, 2013.

CLINICAL DATA: Sepsis.

EXAM:
CHEST - 1 VIEW

[ap]
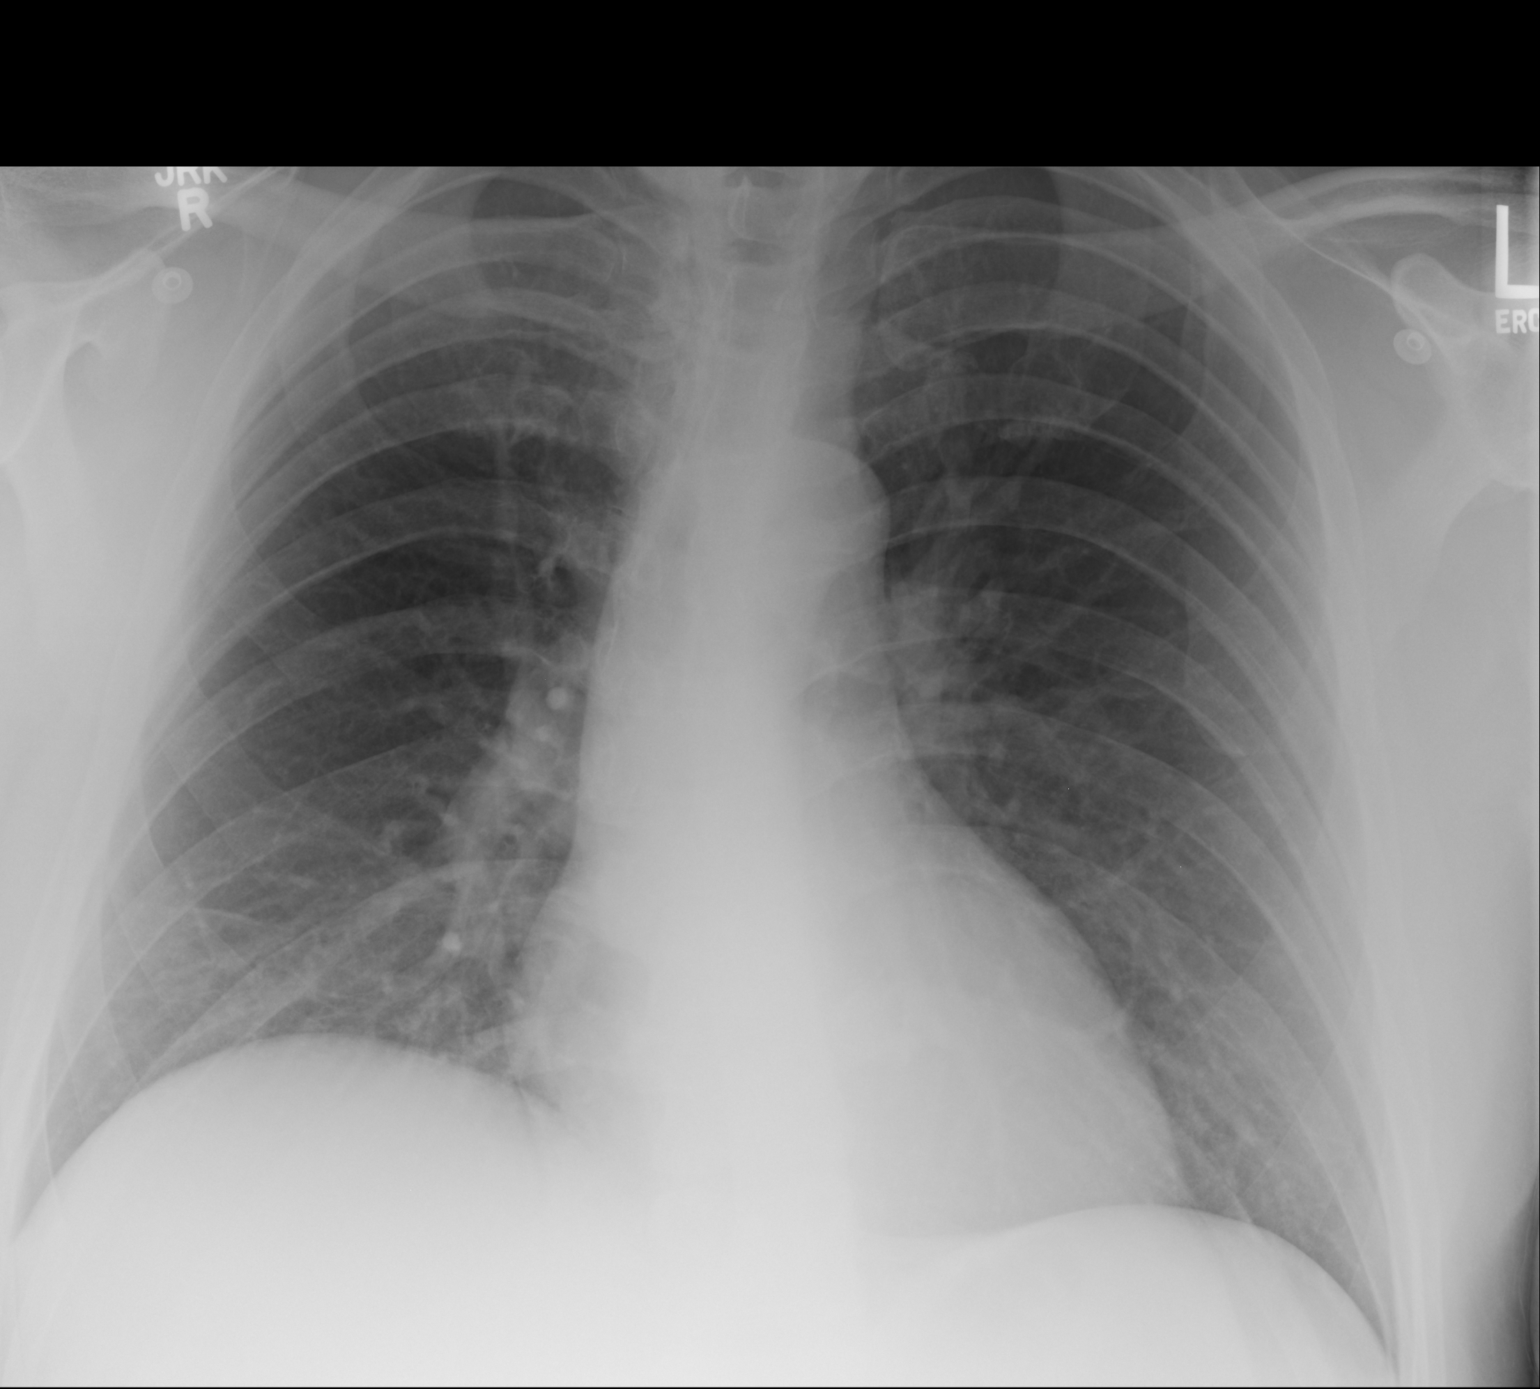

[1 of 1 positions shown; findings below may reference images not displayed]

FINDINGS: The heart size and mediastinal contours are within normal limits.
Both lungs are clear. The visualized skeletal structures are
unremarkable.
IMPRESSION: No active disease.
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
## 2015-03-05 NOTE — Consult Note (Signed)
Admit Diagnosis:   ALTERED MENTAL STATUS SEPSIS: Onset Date: 05-Oct-2013, Status: Active, Description: ALTERED MENTAL STATUS SEPSIS    traumatic brain injury:    PTSD:    Depression:    Gall Bladder:   Home Medications: Medication Instructions Status  citalopram 40 mg oral tablet 1 to 2 tab(s) orally once a day Active  prazosin 2 mg oral capsule 1 cap(s) orally once a day (at bedtime) Active  hydrOXYzine hydrochloride 50 mg oral tablet 1 tab(s) orally every 6 hours, As needed, anxiety or sleep Active   Lab Results: Hepatic:  22-Nov-14 13:21   Bilirubin, Total 0.4  Alkaline Phosphatase 69 (45-117 NOTE: New Reference Range 10/03/13)  SGPT (ALT) 22  SGOT (AST) 18  Total Protein, Serum 7.8  Albumin, Serum 4.4  23-Nov-14 06:21   Bilirubin, Total 0.7  Alkaline Phosphatase 54 (45-117 NOTE: New Reference Range 10/03/13)  SGPT (ALT) 19  SGOT (AST) 22  Total Protein, Serum  6.3  Albumin, Serum 3.5  Routine Micro:  22-Nov-14 13:21   Micro Text Report URINE CULTURE   COMMENT                   NO GROWTH IN 8-12 HOURS   ANTIBIOTIC                       Culture Comment NO GROWTH IN 8-12 HOURS  Result(s) reported on 05 Oct 2013 at 09:47AM.    14:15   Micro Text Report BLOOD CULTURE   COMMENT                   NO GROWTH IN 8-12 HOURS   ANTIBIOTIC                       Micro Text Report BLOOD CULTURE   COMMENT                   NO GROWTH IN 8-12 HOURS   ANTIBIOTIC                       Culture Comment NO GROWTH IN 8-12 HOURS  Result(s) reported on 04 Oct 2013 at 10:00PM.  Culture Comment NO GROWTH IN 8-12 HOURS  Result(s) reported on 04 Oct 2013 at 10:00PM.  Lab:  22-Nov-14 15:05   Lactic Acid, Cardiopulmonary  3.0 (Result(s) reported on 04 Oct 2013 at 03:19PM.)  Routine Chem:  22-Nov-14 13:21   Glucose, Serum  138  BUN 9  Creatinine (comp)  1.57  Sodium, Serum 143  Potassium, Serum  2.9  Chloride, Serum  108  CO2, Serum  13    19:14   Glucose, Serum   105  BUN 8  Creatinine (comp) 1.08  Sodium, Serum 143  Potassium, Serum  2.9  Chloride, Serum  109  CO2, Serum 26  23-Nov-14 06:21   Glucose, Serum 90  BUN  6  Creatinine (comp) 1.13  Sodium, Serum 144  Potassium, Serum  3.3  Chloride, Serum  114  CO2, Serum 27  Magnesium, Serum 1.8 (1.8-2.4 THERAPEUTIC RANGE: 4-7 mg/dL TOXIC: > 10 mg/dL  -----------------------)  Urine Drugs:  22-Nov-14 13:21   Tricyclic Antidepressant, Ur Qual (comp) NEGATIVE (Result(s) reported on 04 Oct 2013 at 02:43PM.)  Amphetamines, Urine Qual. NEGATIVE  MDMA, Urine Qual. NEGATIVE  Cocaine Metabolite, Urine Qual. NEGATIVE  Opiate, Urine qual NEGATIVE  Phencyclidine, Urine Qual. NEGATIVE  Cannabinoid, Urine Qual. NEGATIVE  Barbiturates, Urine  Qual. NEGATIVE  Benzodiazepine, Urine Qual. NEGATIVE (----------------- The URINE DRUG SCREEN provides only a preliminary, unconfirmed analytical test result and should not be used for non-medical  purposes.  Clinical consideration and professional judgment should be  applied to any positive drug screen result due to possible interfering substances.  A more specific alternate chemical method must be used in order to obtain a confirmed analytical result.  Gas chromatography/mass spectrometry (GC/MS) is the preferred confirmatory method.)  Methadone, Urine Qual. NEGATIVE  Routine UA:  22-Nov-14 13:21   Color (UA) Yellow  Clarity (UA) Clear  Glucose (UA) 50 mg/dL  Bilirubin (UA) Negative  Ketones (UA) 1+  Specific Gravity (UA) 1.027  Blood (UA) Negative  pH (UA) 5.0  Protein (UA) 30 mg/dL  Nitrite (UA) Negative  Leukocyte Esterase (UA) Negative (Result(s) reported on 04 Oct 2013 at 02:52PM.)  RBC (UA) 3 /HPF  WBC (UA) 8 /HPF  Bacteria (UA) TRACE  Epithelial Cells (UA) NONE SEEN  Mucous (UA) PRESENT (Result(s) reported on 04 Oct 2013 at 02:52PM.)  Routine Hem:  22-Nov-14 13:21   WBC (CBC)  16.4  Hemoglobin (CBC) 15.5  Hematocrit (CBC) 48.1   Platelet Count (CBC) 165 (Result(s) reported on 04 Oct 2013 at 02:12PM.)  23-Nov-14 06:21   WBC (CBC) 9.5  Hemoglobin (CBC) 13.5  Hematocrit (CBC)  39.0  Platelet Count (CBC)  118   Radiology Results:  Radiology Results: CT:    22-Nov-14 20:47, CT Abdomen and Pelvis With Contrast  CT Abdomen and Pelvis With Contrast  REASON FOR EXAM:    (1) sepsis; (2) same  COMMENTS:       PROCEDURE: CT  - CT ABDOMEN / PELVIS  W  - Oct 04 2013  8:47PM     CLINICAL DATA:  Sepsis.  Pyuria.    EXAM:  CT ABDOMEN AND PELVIS WITH CONTRAST    TECHNIQUE:  Multidetector CT imaging of theabdomen and pelvis was performed  using the standard protocol following bolus administration of  intravenous contrast.  CONTRAST:  100 cc Isovue-300    COMPARISON:  None.    FINDINGS:  BODY WALL: Unremarkable.    LOWER CHEST: Mild dependent atelectasis bilaterally.    ABDOMEN/PELVIS:    Liver: Lobulated 2.4 cm circumscribed low-attenuation lesion in the  medial segment left lobe liver, most consistent with cyst. High  attenuation around the left lateral wall is likely a traversing  vessel rather than enhancing nodule.  Biliary: Cholecystectomy.    Pancreas: Unremarkable.    Spleen: Unremarkable.    Adrenals: Unremarkable.    Kidneys and ureters: Fullness the bilateral urinary collecting  systems without visible obstruction, likely from urinary bladder  distention and extrarenal pelves.    Bladder: Moderately distended.    Reproductive: Unremarkable.  Bowel: No obstruction. Appendectomy.    Retroperitoneum: No mass or adenopathy.    Peritoneum: No free fluid or gas.    Vascular: No acute abnormality.    OSSEOUS: Focally advanced degenerative disc disease at L5-S1, with  mild retrolisthesis.     IMPRESSION:  1. No evidence of intra-abdominal infection.  2. Urinary bladder distention.  Electronically Signed    By: Tiburcio Pea M.D.  On: 10/04/2013 21:02         Verified By:  Audry Riles, M.D.,    No Known Allergies:   Nursing Flowsheets: **Vital Signs.:   23-Nov-14 05:40  Vital Signs Type Routine  Temperature Temperature (F) 98.5  Celsius 36.9  Temperature Source oral  Pulse Pulse 82  Respirations Respirations 18  Systolic BP Systolic BP 111  Diastolic BP (mmHg) Diastolic BP (mmHg) 65  Mean BP 80  Pulse Ox % Pulse Ox % 95  Pulse Ox Activity Level  At rest  Oxygen Delivery Room Air/ 21 %  *Intake and Output.:   Shift 23-Nov-14 07:00  Grand Totals Intake:  1534 Output:  1600    Net:  -66 24 Hr.:  -66  IV (Primary)      In:  1487  IV (Secondary)      In:  47  Urine ml     Out:  1600  Length of Stay Totals Intake:  1534 Output:  1600    Net:  -66    General Aspect reason for consult: urinary retention   Present Illness 44 y/o M with a h/o urinary retention seen in consultation at the request of Dr. Berlinda Last for evaluation and recommendations regarding urinary retention. Mr. Jasinski was admitted with AMS changes on unclear etiology yesterday. This morning I was called by the nursing as the patient was noted on CT to have a distended bladder and a bladder scan showed >999cc. They tried multiple times to get a foley in, but ultimately they got in a 57fr coude after using some urojet. The patient himself is essentially somnolent and does not stay awake for more than a few seconds so all of my history is from the notes and nursing and the primary team. no h/o urinary problems prior to admission per notes. he does take prazosin which I wonder if that is for some h/o LUTS or BPH symptoms   Case History and Physical Exam:  Past Surgical History Cholecystectomy   Family History Non-Contributory   Neck/Nodes Supple   Chest/Lungs Clear   Cardiovascular Normal Sinus Rhythm   Abdomen Benign  Active bowel sounds   Genitalia normal circ'ed phallus with no lesions, testicles desc b/l with no masses   Rectal Not examined  patient to  somnolent for me to perform   Skin Warm  Dry    Impression 44 y/o M with urinary retention, likely due to his AMS. unable to get any urologic history from patient.   Plan - would keep foley x 7-14 days - will need outpatient TOV or if patient still in house can be done then, but after 7-14 days - would hold on tamsulosin given hypotension - please contact Dr. Edwyna Shell tomorrow for any questions or concerns.   Electronic Signatures: Lisabeth Pick (MD)  (Signed [phone] 10:26)  Authored: Health Issues, Significant Events - History, Home Medications, Labs, Radiology Results, Allergies, Vital Signs, General Aspect/Present Illness, History and Physical Exam, Impression/Plan   Last Updated: 23-Nov-14 10:26 by Lisabeth Pick (MD)
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
## 2015-03-05 NOTE — Discharge Summary (Signed)
PATIENT NAME:  Harold [name], Harold [name] MR#:  [id number] DATE OF BIRTH:  03-31-1971  DATE OF ADMISSION:  10/04/2013 DATE OF DISCHARGE:  10/06/2013  ADMITTING PHYSICIAN: Hilma Favors, MD  DISCHARGING PHYSICIAN: Gladstone Lighter, MD   PRIMARY CARE PHYSICIAN: At [place]: Urology consultation with Nila Nephew, MD.  DISCHARGE DIAGNOSES: 1.  Acute toxic metabolic encephalopathy from medications, which has resolved.  2.  Bipolar disorder.  3.  Posttraumatic stress disorder.  4.  Acute urinary retention, being discharged on Foley catheter based on urology recommendations.   DISCHARGE MEDICATIONS:  1.  Prazosin 2 mg p.o. at bedtime.  2.  Celexa 40 mg p.o. daily.  3.  His home medication of hydroxyzine is advised to be held at this time.   DISCHARGE DIET: Regular diet.   DISCHARGE ACTIVITY: As tolerated.   FOLLOW-UP INSTRUCTIONS:  1.  To follow up with urology in less than a week.  2.  Foley catheter until seen by urology.   LABORATORIES AND IMAGING STUDIES PRIOR TO DISCHARGE: Sodium 142, potassium 4.0, chloride 111, bicarb 29, BUN 4, creatinine 1.06, glucose 84 and calcium of 8.4. Plasma ammonia is less than 25.   WBC 9.4, hemoglobin 13.3, hematocrit 39.0, platelet count 118.   Chest x-ray revealing clear lung fields. No acute cardiopulmonary disease.    ALT 19, AST 22, alk phos 54, total bilirubin 0.7 and albumin of 3.5. Magnesium 1.8. TSH 0.93.   CT of the abdomen and pelvis showing no acute intra-abdominal infection and distended urinary bladder noted.   CT of the head without contrast on admission showing normal study.   Blood cultures were negative. Urine cultures were negative. Urine tox screen was negative. Urine alcohol level was normal on admission.   BRIEF HOSPITAL COURSE: Harold [name] is a 44 year old young Caucasian male with past medical history significant for posttraumatic stress disorder, bipolar disorder, traumatic brain injury, and  chronic back pain, who follows at [facility] who presents to the hospital secondary to lethargy and decreased responsiveness.   1.  Acute encephalopathy, likely secondary to toxic metabolic reasons. The patient has been working multiple shifts and he was sleepy and might have taken twice his medications than normal at presentation and from what the patient and his wife told.  Bottles were seen and according to the wife, only two doses of the medications were missing. He was on hydroxyzine, which can cause drowsiness, and also higher doses of Celexa, which can cause drowsiness.   My initial thought was the patient had sepsis; however, sepsis was ruled out. He had an elevated white count, which could be stress reaction, and he was hypotensive, probably from the medications, and the blood pressure improved with IV fluids. His cultures were negative. His antibiotics were discontinued. After 36 hours, the patient's mental status returned back. He has not had any further fevers so no LP was required.   CT of the head was normal and the patient is back to normal at this time. His hydroxyzine is being held for now and he can be started back on his Celexa and Prazosin at this time.   1.  Acute urinary retention in the hospital. The patient had acute urinary retention. Initially was thought to be secondary to anticholinergic effects from hydroxyzine, but the patient had a similar problem after septoplasty surgery in the past for which he required a urological intervention to dilate the stricture. He was seen by Dr. Sharlette Dense, urologist, in the hospital because  the patient was retaining about 1000 mL of urine and normal Foley catheter was difficult to be passed. They  passed a Coude Foley, 18-French,  and discussed with Dr. Elnoria Howard, urologist, prior to discharge who  recommended to discharge the patient on the Foley and have outpatient follow-up in less than a week at which time they will proceed with urological procedure if  needed for urethral stricture dilatation. The patient was informed of this and he is agreeable to that.   2.  Bipolar disorder, traumatic brain injury, and posttraumatic stress disorder. His medications were continued at time of discharge and he can follow with outpatient psychiatrist.   3.  His course has been otherwise uneventful in the hospital.   DISCHARGE CONDITION: Stable.   DISCHARGE DISPOSITION: Home.   TIME SPENT ON DISCHARGE: 45 minutes.  ____________________________ Gladstone Lighter, MD rk:[id number] D: 10/06/2013 14:48:00 ET T: 10/06/2013 21:51:04 ET JOB#: [id number]  cc: Gladstone Lighter, MD, <Dictator> [place] MD ELECTRONICALLY SIGNED 10/07/2013 17:51

## 2015-03-05 NOTE — H&P (Signed)
PATIENT NAME:  Harold [name], [name] MR#:  [id number] DATE OF BIRTH:  Mar 27, 1971  DATE OF ADMISSION:  10/04/2013  REASON FOR ADMISSION: Altered mental status and sepsis.  [facility] [place]   REFERRING PHYSICIAN: Dr. Governor Rooksebecca Lord  HISTORY OF PRESENT ILLNESS: A very nice 44 year old gentleman with history of TBI, posttraumatic stress disorder, bipolar disorder, celiac disease, and chronic back pain, who came today with a history of altered mental status, shaking. 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 He has not been able to sleep for a couple of days because of changes in his schedule due to his increase of appointments [facility] for [facility]. 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 Yesterday he came out of work at 4:00 p.m. after working his normal 8-hour shift. He stayed at home. He did some visits to friends with his wife, and then he tried to sleep from 10 to 7811 because he needed to go back to work at midnight, but he was not able to sleep that full hour. The patient came back from work this morning, and he called his wife, telling her that he might have taken his medications twice. When the wife came to see him, the patient was very sleepy. He was brought up to the Emergency Department, and he was evaluated there.   On the first evaluation, the patient got Narcan, and it did not really wake him up much more than he was already. He was  significantly tachycardic, and he was hypotensive, with blood pressure 80s over 50s. After IV fluids, his blood pressure is now borderline low, 100s over 60s. He was tachycardic up to 129. His temperature has been 98.1, oxygen saturation was around 97% on room air. Now he is on oxygen just as a preventative measure. When in the interview, the patient is still very sleepy,  confused. He is not able to give a lot of information. Most of the information has been recorded by the  wife. She states that he has a chronic cough. He has not had any abdominal pain, chest pain. No fevers at home. He has been his normal self. He has been taking his medications on a regular basis. The patient is admitted for evaluation of sepsis.   REVIEW OF SYSTEMS:  Unable to obtain a full review of systems, as patient is still lethargic, sleepy, and slightly confused.   PAST MEDICAL HISTORY: 1.  PTSD.  2.  Bipolar disorder.  3.  TBI.  4.  Celiac disease.  5.  Chronic back pain.  ALLERGIES:  No known drug allergies.   SOCIAL HISTORY:  The patient is a Cytogeneticistveteran, goes to the [facility]. He used for work for the [employer] as well, but now he is a Electrical engineersecurity guard. He smokes occasionally, 1 or 2 cigarettes or 3 a week. He does not drink, as he had problems with alcohol before. He does not use any other illegal drugs.   SURGICAL HISTORY:  Cholecystectomy in 2012. This is the only surgery that he has had.   FAMILY HISTORY:  No known cancer, coronary artery disease, or significant mood problems.   CURRENT MEDICATIONS:  Include Celexa 80 mg once a day, hydroxyzine 50 mg every 6 hours as needed for anxiety, and prazosin 2 mg once a day   PHYSICAL EXAMINATION: VITAL SIGNS:  Blood pressure 104/63 now, pulse 87, respirations 20, O2 saturation 97% on room  air.  On admission, he had a pulse that was 126, went up to 133. His respiratory rate has been between 18 and 24. His temperature was 98.1. His blood pressure dropped to 86/57. After IV fluids, 1 liter, he has some improvement.  GENERAL: The patient is very lethargic.  HEENT: His pupils are equal and reactive. Extraocular movements are intact. Mucosa is very dry. Anicteric sclerae. Pink conjunctivae. No oral lesions. No oropharyngeal exudates.  NECK: Supple. No JVD. No thyromegaly. No adenopathy. No rigidity. No Kernig, no Brudzinski.  CARDIOVASCULAR: Regular rate and rhythm. No murmurs, rubs, or gallops. Slightly tachycardic now. No displacement of PMI.   LUNGS: Clear, without any wheezing or crepitus that I can appreciate at this moment. No use of accessory muscles.  ABDOMEN: Soft, nontender, nondistended. No hepatosplenomegaly. No masses. Bowel sounds are positive. No rebound tenderness. No guarding.  GENITAL: Exam negative for external lesions.  EXTREMITIES:  No edema, cyanosis or clubbing. Pulses +2. Capillary refill less than 3 on vascular exam.  SKIN: No rashes, petechiae. No sweating. No diaphoresis in general. LYMPHATIC: Negative for lymphadenopathy in the neck or supraclavicular areas.  MUSCULOSKELETAL: No significant joint effusions or exudates.  PSYCHIATRIC: The patient looks anxious. He has shaking legs and arms, but at the same time he is lethargic. When he wakes up, he is able to answer questions of where he is, what his name is, who is his wife. He knows the time of the year, the month, and he knows that today is Saturday, but he is confused as far as other things. Whenever we ask him questions, he answers in a very disorganized way. He is not able to answer complex questions, and he is not able to follow my commands enough because he falls asleep again.  NEUROLOGIC: Cranial nerves II through XII are overall intact, and there is normal strength, 5/5 in 4 extremities.   RESULTS:  Creatinine 1.57, glucose 138, potassium 2.9, CO2 is 13. The patient is acidotic. His anion gap is slightly elevated at 22. His lactic acid is 3. His pCO2 is 37, pH is 7.38. Toxic screen is negative. Tylenol and salicylate levels are negative. His white count is elevated at 16,000, his hemoglobin is 15.5, platelet count is 165. His urinalysis shows 8 white blood cells, 3 red blood cells, positive ketones, elevated glucose. Previous urinalysis on this patient has shown no white blood cells, so it is possible it could be related to urine infection. His chest x-ray did not show any significant abnormalities. EKG shows sinus tachycardia, no ST depression or elevation.    ASSESSMENT AND PLAN: A 44 year old gentleman with severe sepsis, and not a clear source.   1.  Severe sepsis. The patient is admitted for evaluation of this condition. He is being started on broad-spectrum antibiotics, including Zosyn and vancomycin. At this moment, he has no significant symptoms that could lead [place] to the cause, but he has altered mental status and he also has significant TBI, for what his risk of mortality from sepsis is increased. The patient, since we do not have a clear cut, I am going to get a CT scan of the abdomen that will allow me to evaluate the possibility of pyelonephritis or any other intra-abdominal infection. He has 8 white blood cells on his urinalysis, but he cannot tell me if he has dysuria. His abdominal exam did not show any significant abnormalities but, again, he does have traumatic brain injury and significant altered mental status, for what something could  be masked, so CT scan at this moment is indicated. As far as his sepsis, he has elevation of white blood cells of 1600. He has tachycardia in the 130s. He has decreased blood pressure below 90, which has improved with IV fluids, which makes him meet criteria for severe sepsis. IV fluids have been started at 125 an hour. His blood cultures, urine cultures have been taken. If we can get some sputum, we are going to get respiratory cultures as well, although the patient is not coughing up any secretions. He does have some chronic cough, for what we are going to try to induce his cultures later on. For now, we are going to continue with close monitoring and broad-spectrum antibiotics. Will try to stop the vancomycin if his cultures are negative tomorrow.  2.  Metabolic acidosis. The patient has, actually, lactic acidosis, with a CO2 of 13. We are going to recheck his BMP in the next 2 hours, and if he is still acidotic, we are going to give him some bicarbonate drip, but for now, we are just going to expand his volume.    3.  As far as his other problems, he has also acute kidney injury, with a creatinine of 1.59. Baseline is 0.9. IV fluids given.   4.  Severe hypokalemia. Potassium has been ordered to be replaced with IV fluids. We are going to give him 20 mEq of potassium chloride as well on top of his IV fluids.   5.  History of traumatic brain injury, depression, and post-traumatic stress disorder. At this moment, we are going to hold his medications, as he has decreased mental status and, per history, he probably took a double dose anyway.   6.  Altered mental status, likely secondary to sepsis. He does not meet any of the criteria for serotonin syndrome, which would be what you can see with an overdose of Celexa, but we are going to keep an eye on him and monitor with telemetry.   7.  All medical problems are stable at this moment. We are going to cover him with proton pump inhibitor for gastrointestinal prophylaxis and Lovenox for deep vein thrombosis prophylaxis.   I spent about 50 minutes with this patient.       ____________________________ Felipa Furnace, MD rsg:mr D: 10/04/2013 17:09:00 ET T: 10/04/2013 18:07:33 ET JOB#: [id number]  cc: Felipa Furnace, MD, <Dictator> Phiona Ramnauth Juanda Chance MD ELECTRONICALLY SIGNED 10/06/2013 13:06
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
## 2015-03-06 NOTE — Discharge Summary (Signed)
PATIENT NAME:  Harold [name], Darreon D MR#:  [id number] DATE OF BIRTH:  01/01/1971  DATE OF ADMISSION:  11/29/2013 DATE OF DISCHARGE:  11/30/2013  PRIMARY CARE PHYSIC[facility]  FINAL DIAGNOSES: 1.  Chest pain, likely musculoskeletal in nature.  2.  Anxiety and depression.  3.  Tobacco abuse.   DISCHARGE MEDICATIONS:  Include prazosin 2 mg at bedtime, Celexa 40 mg 1.5 mg daily, hydroxyzine/hydrochloride 2 tablets in the afternoon and 3 tablets in the evening.   DIET: Regular diet.   ACTIVITY: As tolerated.   DISCHARGE FOLLOWUP: With Dr. Adrian BlackwaterShaukat Khan, cardiology, at 10:30 a.m. on this Tuesday morning, [facility] [place] in [facility] to 2 [facility].   REASON FOR ADMISSION: The patient was admitted as an observation 11/29/2013 and discharged 11/30/2013. Came in with chest pain.  LABORATORY AND RADIOLOGIC DATA DURING THE HOSPITAL COURSE: Included a glucose of 99, BUN 11, creatinine 0.91, sodium 139, potassium 3.7, chloride 108, CO2 28, calcium 8.6. Liver function tests normal range. White blood cell count 6.5, H and H 15.2 and 45.6, platelet count 122. Troponin negative.   Chest x-ray: No acute cardiopulmonary disease.   EKG: Sinus bradycardia. Cardiac enzymes x3 were negative. LDL 91, HDL 41, triglycerides 84.  Stress test with exercise treadmill: The patient exercised for 10 minutes, normal interpretation of test.   HOSPITAL COURSE PER PROBLEM LIST:  1.  For the patient's chest pain, the patient thinks it was reproducible with palpation. Could be musculoskeletal in nature. The patient had 3 negative cardiac enzymes. This is not a myocardial infarction. The patient had a negative exercise treadmill test. The patient did not have any further pain, which goes against pulmonary embolism or aortic dissection. Further clinical follow-up as outpatient.  2.  For his anxiety and depression, no changes in medications were made.  3.  For his tobacco abuse, smoking cessation counseling was done on  admission.  TIME SPENT ON DISCHARGE: 35 minutes.  ____________________________ Herschell Dimesichard J. Renae GlossWieting, MD rjw:sb D: 11/30/2013 14:12:43 ET T: 12/01/2013 08:00:50 ET JOB#: [id number]  cc: Herschell Dimesichard J. Renae GlossWieting, MD, <Dictator> [facility] Laurier NancyShaukat A. Khan, MD  Salley ScarletICHARD J Ryleigh Esqueda MD ELECTRONICALLY SIGNED 12/01/2013 17:07

## 2015-03-06 NOTE — Consult Note (Signed)
PATIENT NAME:  Harold [name], Harold [name] MR#:  [id number] DATE OF BIRTH:  1971-09-07  DATE OF CONSULTATION:  11/30/2013  CARDIOLOGY CONSULTATION  CONSULTING PHYSICIAN: Laurier NancyShaukat A. Elizaveta Mattice, M.[name].   INDICATION FOR CONSULTATION: Chest pain.   HISTORY OF PRESENT ILLNESS: This is a 44 year old white male with a history of smoking, came into the Emergency Room with chest pain. The chest pain was described as pressure-type associated with shortness of breath. Chest pain radiated to the left arm and left jaw. It was pressure-type. He ruled out for myocardial infarction with three sets of cardiac enzymes and troponin but was about to be discharged and started having chest pain again. Thus, I was asked to evaluate the patient and do a regular stress test. A regular stress test was done which shows no evidence of any ischemia or ST depression. The METS were 10 and he walked for like stage 4 of Bruce protocol and achieved a heart rate of 151 but did have chest pain while he was on the treadmill. Hemodynamically was stable.   PAST MEDICAL HISTORY: No history of hypertension, diabetes, or hypercholesterolemia.   SOCIAL HISTORY: He smokes about 1/2 pack per day and occasionally drinks also.   FAMILY HISTORY: Is strongly positive for coronary artery disease, father, brother, mother all had coronary artery disease at a young age,   PHYSICAL EXAMINATION: GENERAL: He is alert, oriented x 3, in no acute distress.  VITAL SIGNS: Stable.  NECK: No JVD.  LUNGS: Clear.  HEART: Regular rate and rhythm. Normal S1, S2. No audible murmur.  ABDOMEN: Soft, nontender, positive bowel sounds.  EXTREMITIES: No pedal edema.  NEUROLOGIC: The patient appears to be intact.   LABS/STUDIES: EKG shows normal sinus rhythm. No acute changes. Three sets of cardiac enzymes are negative.   ASSESSMENT AND PLAN: Chest pain, anginal type. Myocardial infarction has been ruled out. A regular stress test was done, it was normal. He still has intermittent  chest pain, but since he achieved 10 METs and appears to be okay we will discharge the patient with follow-up in the office on Tuesday at 10:30 this week.   Thank you very much for the referral   ____________________________ Laurier NancyShaukat A. Cyler Kappes, MD sak:sg [name]: 11/30/2013 09:22:20 ET T: 11/30/2013 09:45:40 ET JOB#: [id number]  cc: Laurier NancyShaukat A. Kaileb Monsanto, MD, <Dictator> Laurier NancySHAUKAT A Milan Clare MD ELECTRONICALLY SIGNED 12/04/2013 8:42

## 2015-03-06 NOTE — H&P (Signed)
PATIENT NAME:  Harold [name], Harold [name] MR#:  [id number] DATE OF BIRTH:  [birth year]  DATE OF ADMISSION:  11/29/2013  PRIMARY CARE PHYSICIAN: From [place].   The patient does not want to go to [facility]. We tried to transfer him, and the transfer process was being initiated, but he does not want to go to [facility], so we are going to admit him for chest pain, rule out.  CHIEF COMPLAINT: Chest pain.   HISTORY OF PRESENT ILLNESS: The patient is a 44 year old male patient with history of depression, anxiety. Comes in with chest pain. The patient noted to have left arm pain when he woke up this morning, then he realized that the pain is actually coming from his chest. The patient felt like heaviness in the chest associated with some nausea and some shortness of breath, so he came to the Emergency Room. The patient was given aspirin and also morphine. First troponins were negative. The patient was monitored and EKG also unremarkable. Second troponin also is negative, but the patient continues to have chest pain, relieved with nitroglycerin. Concerning this, we were asked to admit the patient. The patient has no exertional dyspnea. No syncope. The patient's chest pain radiating only to the arm, but not to the back.  PAST MEDICAL HISTORY: Significant for a history of depression and anxiety.   PAST SURGICAL HISTORY: The patient has a history of gallbladder surgery and appendectomy.   MEDICATIONS: He takes Celexa 40 mg 1-1/2 tablets daily, hydroxyzine hydrochloride 10 mg takes 2 tablets once a day in the afternoon and 3 tablets (that is, 30 mg) in the night, prazosin 2 mg once a day at bedtime.   SOCIAL HISTORY: Smokes about 4 to 5 cigarettes a day. No alcohol. No drugs. The patient right now is still in [employer].   FAMILY HISTORY: Both parents and sisters have diabetes, but no history of heart disease.   REVIEW OF SYSTEMS:    CONSTITUTIONAL: The patient has no fever, no fatigue.  EYES: No blurred vision.   EARS, NOSE, THROAT: No tinnitus. No epistaxis.  RESPIRATORY: No cough. No wheezing.  CARDIOVASCULAR: The patient has chest pain today. No orthopnea. No pedal edema. No palpitations.  GASTROINTESTINAL: No nausea. The patient has no vomiting, no abdominal pain.  GENITOURINARY: No dysuria.  ENDOCRINE: No polyuria or nocturia.  HEMATOLOGIC: No anemia.  INTEGUMENTARY: No skin rashes.  MUSCULOSKELETAL: No joint pain.  NEUROLOGIC: No numbness or weakness.  PSYCHIATRIC: Does have anxiety and depression.   PHYSICAL EXAMINATION: VITAL SIGNS: Temperature 96.8, heart rate 69, blood pressure 123/77, 95%RA GENERAL: The patient is alert, awake, oriented. He is a 44 year old male, not in distress, answering questions appropriately.  HEAD: Atraumatic, normocephalic.  EYES: Pupils equally reacting to light. Extraocular movements are intact.  EARS, NOSE, THROAT: No tympanic membrane congestion. No turbinate hypertrophy. No oropharyngeal erythema.  NECK: Supple. No JVD. No carotid bruits. RESPIRATORY: Clear to auscultation. No wheeze noted.  CARDIOVASCULAR: S1, S2 regular. No murmurs. PMI not displaced. Pulses are equal at carotid, femoral, and dorsalis pedis. No peripheral edema. The patient has chest wall tenderness on the left anterior chest.  GASTROINTESTINAL: Abdomen is soft, nontender, nondistended. Bowel sounds present.  MUSCULOSKELETAL: Normal gait and station. Able to move extremities x 4.  SKIN: Inspection within normal limits.  NEUROLOGIC: Alert, awake, oriented. Cranial nerves II through XII intact. Power 5/5 in upper and lower extremities. Sensory intact. DTRs 2+ bilaterally.   LABORATORY, DIAGNOSTIC AND RADIOLOGICAL DATA: Troponin is negative x  2, less than 0.02. Chest x-ray: No active disease. WBC 6.5, hemoglobin 15.3, hematocrit 45.6, platelets 122. Electrolytes: Sodium is 139, potassium 3.7, chloride 108, bicarbonate 28, BUN 11, creatinine 0.91, glucose 99. LFTs are within normal limits EKG  repeated 2 times. First EKG that was done this morning at 10:00 showed normal sinus rhythm, no ST-T changes EKG was repeated again because after the second set of troponins were negative, the patient still complained of chest pain. EKG repeated did not show any acute ST-T changes, had only bradycardia at 53 beats per minute.   ASSESSMENT AND PLAN: The patient is a 44 year old male with:  1.  Chest pain, recurrent type, relieved with nitroglycerin, with family history of hypertension, diabetes. We are going to keep him overnight, obtain cardiology consult. The patient will have aspirin, small-dose beta blocker, some nitroglycerin. The patient will get another set of troponins and cardiology consult. The patient does not want to go home and [facility], so we will keep him and see if the third set of troponins comes negative. He will need a cardiology work-up to make sure it is not a cardiac origin.  2.  Possible musculoskeletal chest pain: Continue ibuprofen 400 mg p.o. t.i.d.  3.  Depression: Continue his Celexa.  4.  History of anxiety: Continue his home medication, Vistaril.   TIME SPENT ON THIS HISTORY AND PHYSICAL: About 60 minutes.   ____________________________ Katha HammingSnehalatha Graesyn Schreifels, MD sk:jcm D: 11/29/2013 17:08:08 ET T: 11/29/2013 18:11:24 ET JOB#: [id number]  cc: Katha HammingSnehalatha Missi Mcmackin, MD, <Dictator> Katha HammingSNEHALATHA Gianfranco Araki MD ELECTRONICALLY SIGNED 12/22/2013 16:21
